# Patient Record
Sex: Female | Born: 1980 | Race: Black or African American | Hispanic: No | Marital: Single | Smoking: Never smoker
Health system: Southern US, Community
[De-identification: ages and names within clinical notes are randomized; demographics above are authoritative.]

## PROBLEM LIST (undated history)

## (undated) DIAGNOSIS — Z9109 Other allergy status, other than to drugs and biological substances: Secondary | ICD-10-CM

## (undated) DIAGNOSIS — J329 Chronic sinusitis, unspecified: Secondary | ICD-10-CM

## (undated) DIAGNOSIS — J45909 Unspecified asthma, uncomplicated: Secondary | ICD-10-CM

## (undated) DIAGNOSIS — M419 Scoliosis, unspecified: Secondary | ICD-10-CM

## (undated) DIAGNOSIS — T7840XA Allergy, unspecified, initial encounter: Secondary | ICD-10-CM

## (undated) DIAGNOSIS — B019 Varicella without complication: Secondary | ICD-10-CM

## (undated) DIAGNOSIS — R569 Unspecified convulsions: Secondary | ICD-10-CM

## (undated) HISTORY — DX: Unspecified asthma, uncomplicated: J45.909

## (undated) HISTORY — DX: Unspecified convulsions: R56.9

## (undated) HISTORY — DX: Allergy, unspecified, initial encounter: T78.40XA

## (undated) HISTORY — DX: Scoliosis, unspecified: M41.9

## (undated) HISTORY — DX: Varicella without complication: B01.9

---

## 2004-09-17 ENCOUNTER — Emergency Department (HOSPITAL_COMMUNITY): Admission: EM | Admit: 2004-09-17 | Discharge: 2004-09-17 | Payer: Self-pay | Admitting: Emergency Medicine

## 2005-07-01 ENCOUNTER — Emergency Department (HOSPITAL_COMMUNITY): Admission: EM | Admit: 2005-07-01 | Discharge: 2005-07-01 | Payer: Self-pay | Admitting: Family Medicine

## 2006-02-03 ENCOUNTER — Emergency Department (HOSPITAL_COMMUNITY): Admission: EM | Admit: 2006-02-03 | Discharge: 2006-02-03 | Payer: Self-pay | Admitting: Family Medicine

## 2011-11-05 ENCOUNTER — Ambulatory Visit (INDEPENDENT_AMBULATORY_CARE_PROVIDER_SITE_OTHER): Payer: BC Managed Care – PPO

## 2011-11-05 DIAGNOSIS — B354 Tinea corporis: Secondary | ICD-10-CM

## 2012-05-13 ENCOUNTER — Ambulatory Visit: Payer: BC Managed Care – PPO

## 2012-05-13 ENCOUNTER — Ambulatory Visit (INDEPENDENT_AMBULATORY_CARE_PROVIDER_SITE_OTHER): Payer: BC Managed Care – PPO | Admitting: Internal Medicine

## 2012-05-13 VITALS — BP 117/77 | HR 84 | Temp 97.9°F | Resp 16 | Ht 65.75 in | Wt 168.8 lb

## 2012-05-13 DIAGNOSIS — M25519 Pain in unspecified shoulder: Secondary | ICD-10-CM

## 2012-05-13 MED ORDER — IBUPROFEN 600 MG PO TABS: 600.0000 mg | ORAL_TABLET | Freq: Three times a day (TID) | ORAL | Status: AC | PRN

## 2012-05-13 NOTE — Patient Instructions (Addendum)
Impingement Syndrome, Rotator Cuff, Bursitis with Rehab Impingement syndrome is a condition that involves inflammation of the tendons of the rotator cuff and the subacromial bursa, that causes pain in the shoulder. The rotator cuff consists of four tendons and muscles that control much of the shoulder and upper arm function. The subacromial bursa is a fluid filled sac that helps reduce friction between the rotator cuff and one of the bones of the shoulder (acromion). Impingement syndrome is usually an overuse injury that causes swelling of the bursa (bursitis), swelling of the tendon (tendonitis), and/or a tear of the tendon (strain). Strains are classified into three categories. Grade 1 strains cause pain, but the tendon is not lengthened. Grade 2 strains include a lengthened ligament, due to the ligament being stretched or partially ruptured. With grade 2 strains there is still function, although the function may be decreased. Grade 3 strains include a complete tear of the tendon or muscle, and function is usually impaired. SYMPTOMS   Pain around the shoulder, often at the outer portion of the upper arm.   Pain that gets worse with shoulder function, especially when reaching overhead or lifting.   Sometimes, aching when not using the arm.   Pain that wakes you up at night.   Sometimes, tenderness, swelling, warmth, or redness over the affected area.   Loss of strength.   Limited motion of the shoulder, especially reaching behind the back (to the back pocket or to unhook bra) or across your body.   Crackling sound (crepitation) when moving the arm.   Biceps tendon pain and inflammation (in the front of the shoulder). Worse when bending the elbow or lifting.  CAUSES  Impingement syndrome is often an overuse injury, in which chronic (repetitive) motions cause the tendons or bursa to become inflamed. A strain occurs when a force is paced on the tendon or muscle that is greater than it can  withstand. Common mechanisms of injury include: Stress from sudden increase in duration, frequency, or intensity of training.  Direct hit (trauma) to the shoulder.   Aging, erosion of the tendon with normal use.   Bony bump on shoulder (acromial spur).  RISK INCREASES WITH:  Contact sports (football, wrestling, boxing).   Throwing sports (baseball, tennis, volleyball).   Weightlifting and bodybuilding.   Heavy labor.   Previous injury to the rotator cuff, including impingement.   Poor shoulder strength and flexibility.   Failure to warm up properly before activity.   Inadequate protective equipment.   Old age.   Bony bump on shoulder (acromial spur).  PREVENTION   Warm up and stretch properly before activity.   Allow for adequate recovery between workouts.   Maintain physical fitness:   Strength, flexibility, and endurance.   Cardiovascular fitness.   Learn and use proper exercise technique.  PROGNOSIS  If treated properly, impingement syndrome usually goes away within 6 weeks. Sometimes surgery is required.  RELATED COMPLICATIONS   Longer healing time if not properly treated, or if not given enough time to heal.   Recurring symptoms, that result in a chronic condition.   Shoulder stiffness, frozen shoulder, or loss of motion.   Rotator cuff tendon tear.   Recurring symptoms, especially if activity is resumed too soon, with overuse, with a direct blow, or when using poor technique.  TREATMENT  Treatment first involves the use of ice and medicine, to reduce pain and inflammation. The use of strengthening and stretching exercises may help reduce pain with activity. These exercises may   be performed at home or with a therapist. If non-surgical treatment is unsuccessful after more than 6 months, surgery may be advised. After surgery and rehabilitation, activity is usually possible in 3 months.  MEDICATION  If pain medicine is needed, nonsteroidal  anti-inflammatory medicines (aspirin and ibuprofen), or other minor pain relievers (acetaminophen), are often advised.   Do not take pain medicine for 7 days before surgery.   Prescription pain relievers may be given, if your caregiver thinks they are needed. Use only as directed and only as much as you need.   Corticosteroid injections may be given by your caregiver. These injections should be reserved for the most serious cases, because they may only be given a certain number of times.  HEAT AND COLD  Cold treatment (icing) should be applied for 10 to 15 minutes every 2 to 3 hours for inflammation and pain, and immediately after activity that aggravates your symptoms. Use ice packs or an ice massage.   Heat treatment may be used before performing stretching and strengthening activities prescribed by your caregiver, physical therapist, or athletic trainer. Use a heat pack or a warm water soak.  SEEK MEDICAL CARE IF:   Symptoms get worse or do not improve in 4 to 6 weeks, despite treatment.   New, unexplained symptoms develop. (Drugs used in treatment may produce side effects.)  EXERCISES  RANGE OF MOTION (ROM) AND STRETCHING EXERCISES - Impingement Syndrome (Rotator Cuff  Tendinitis, Bursitis) These exercises may help you when beginning to rehabilitate your injury. Your symptoms may go away with or without further involvement from your physician, physical therapist or athletic trainer. While completing these exercises, remember:   Restoring tissue flexibility helps normal motion to return to the joints. This allows healthier, less painful movement and activity.   An effective stretch should be held for at least 30 seconds.   A stretch should never be painful. You should only feel a gentle lengthening or release in the stretched tissue.  STRETCH - Flexion, Standing  Stand with good posture. With an underhand grip on your right / left hand, and an overhand grip on the opposite hand, grasp  a broomstick or cane so that your hands are a little more than shoulder width apart.   Keeping your right / left elbow straight and shoulder muscles relaxed, push the stick with your opposite hand, to raise your right / left arm in front of your body and then overhead. Raise your arm until you feel a stretch in your right / left shoulder, but before you have increased shoulder pain.   Try to avoid shrugging your right / left shoulder as your arm rises, by keeping your shoulder blade tucked down and toward your mid-back spine. Hold for __________ seconds.   Slowly return to the starting position.  Repeat __________ times. Complete this exercise __________ times per day. STRETCH - Abduction, Supine  Lie on your back. With an underhand grip on your right / left hand and an overhand grip on the opposite hand, grasp a broomstick or cane so that your hands are a little more than shoulder width apart.   Keeping your right / left elbow straight and your shoulder muscles relaxed, push the stick with your opposite hand, to raise your right / left arm out to the side of your body and then overhead. Raise your arm until you feel a stretch in your right / left shoulder, but before you have increased shoulder pain.   Try to avoid shrugging   your right / left shoulder as your arm rises, by keeping your shoulder blade tucked down and toward your mid-back spine. Hold for __________ seconds.   Slowly return to the starting position.  Repeat __________ times. Complete this exercise __________ times per day. ROM - Flexion, Active-Assisted  Lie on your back. You may bend your knees for comfort.   Grasp a broomstick or cane so your hands are about shoulder width apart. Your right / left hand should grip the end of the stick, so that your hand is positioned "thumbs-up," as if you were about to shake hands.   Using your healthy arm to lead, raise your right / left arm overhead, until you feel a gentle stretch in your  shoulder. Hold for __________ seconds.   Use the stick to assist in returning your right / left arm to its starting position.  Repeat __________ times. Complete this exercise __________ times per day.  ROM - Internal Rotation, Supine   Lie on your back on a firm surface. Place your right / left elbow about 60 degrees away from your side. Elevate your elbow with a folded towel, so that the elbow and shoulder are the same height.   Using a broomstick or cane and your strong arm, pull your right / left hand toward your body until you feel a gentle stretch, but no increase in your shoulder pain. Keep your shoulder and elbow in place throughout the exercise.   Hold for __________ seconds. Slowly return to the starting position.  Repeat __________ times. Complete this exercise __________ times per day. STRETCH - Internal Rotation  Place your right / left hand behind your back, palm up.   Throw a towel or belt over your opposite shoulder. Grasp the towel with your right / left hand.   While keeping an upright posture, gently pull up on the towel, until you feel a stretch in the front of your right / left shoulder.   Avoid shrugging your right / left shoulder as your arm rises, by keeping your shoulder blade tucked down and toward your mid-back spine.   Hold for __________ seconds. Release the stretch, by lowering your healthy hand.  Repeat __________ times. Complete this exercise __________ times per day. ROM - Internal Rotation   Using an underhand grip, grasp a stick behind your back with both hands.   While standing upright with good posture, slide the stick up your back until you feel a mild stretch in the front of your shoulder.   Hold for __________ seconds. Slowly return to your starting position.  Repeat __________ times. Complete this exercise __________ times per day.  STRETCH - Posterior Shoulder Capsule   Stand or sit with good posture. Grasp your right / left elbow and draw it  across your chest, keeping it at the same height as your shoulder.   Pull your elbow, so your upper arm comes in closer to your chest. Pull until you feel a gentle stretch in the back of your shoulder.   Hold for __________ seconds.  Repeat __________ times. Complete this exercise __________ times per day. STRENGTHENING EXERCISES - Impingement Syndrome (Rotator Cuff Tendinitis, Bursitis) These exercises may help you when beginning to rehabilitate your injury. They may resolve your symptoms with or without further involvement from your physician, physical therapist or athletic trainer. While completing these exercises, remember:  Muscles can gain both the endurance and the strength needed for everyday activities through controlled exercises.   Complete these exercises as   instructed by your physician, physical therapist or athletic trainer. Increase the resistance and repetitions only as guided.   You may experience muscle soreness or fatigue, but the pain or discomfort you are trying to eliminate should never worsen during these exercises. If this pain does get worse, stop and make sure you are following the directions exactly. If the pain is still present after adjustments, discontinue the exercise until you can discuss the trouble with your clinician.   During your recovery, avoid activity or exercises which involve actions that place your injured hand or elbow above your head or behind your back or head. These positions stress the tissues which you are trying to heal.  STRENGTH - Scapular Depression and Adduction   With good posture, sit on a firm chair. Support your arms in front of you, with pillows, arm rests, or on a table top. Have your elbows in line with the sides of your body.   Gently draw your shoulder blades down and toward your mid-back spine. Gradually increase the tension, without tensing the muscles along the top of your shoulders and the back of your neck.   Hold for  __________ seconds. Slowly release the tension and relax your muscles completely before starting the next repetition.   After you have practiced this exercise, remove the arm support and complete the exercise in standing as well as sitting position.  Repeat __________ times. Complete this exercise __________ times per day.  STRENGTH - Shoulder Abductors, Isometric  With good posture, stand or sit about 4-6 inches from a wall, with your right / left side facing the wall.   Bend your right / left elbow. Gently press your right / left elbow into the wall. Increase the pressure gradually, until you are pressing as hard as you can, without shrugging your shoulder or increasing any shoulder discomfort.   Hold for __________ seconds.   Release the tension slowly. Relax your shoulder muscles completely before you begin the next repetition.  Repeat __________ times. Complete this exercise __________ times per day.  STRENGTH - External Rotators, Isometric  Keep your right / left elbow at your side and bend it 90 degrees.   Step into a door frame so that the outside of your right / left wrist can press against the door frame without your upper arm leaving your side.   Gently press your right / left wrist into the door frame, as if you were trying to swing the back of your hand away from your stomach. Gradually increase the tension, until you are pressing as hard as you can, without shrugging your shoulder or increasing any shoulder discomfort.   Hold for __________ seconds.   Release the tension slowly. Relax your shoulder muscles completely before you begin the next repetition.  Repeat __________ times. Complete this exercise __________ times per day.  STRENGTH - Supraspinatus   Stand or sit with good posture. Grasp a __________ weight, or an exercise band or tubing, so that your hand is "thumbs-up," like you are shaking hands.   Slowly lift your right / left arm in a "V" away from your thigh,  diagonally into the space between your side and straight ahead. Lift your hand to shoulder height or as far as you can, without increasing any shoulder pain. At first, many people do not lift their hands above shoulder height.   Avoid shrugging your right / left shoulder as your arm rises, by keeping your shoulder blade tucked down and toward your mid-back   spine.   Hold for __________ seconds. Control the descent of your hand, as you slowly return to your starting position.  Repeat __________ times. Complete this exercise __________ times per day.  STRENGTH - External Rotators  Secure a rubber exercise band or tubing to a fixed object (table, pole) so that it is at the same height as your right / left elbow when you are standing or sitting on a firm surface.   Stand or sit so that the secured exercise band is at your uninjured side.   Bend your right / left elbow 90 degrees. Place a folded towel or small pillow under your right / left arm, so that your elbow is a few inches away from your side.   Keeping the tension on the exercise band, pull it away from your body, as if pivoting on your elbow. Be sure to keep your body steady, so that the movement is coming only from your rotating shoulder.   Hold for __________ seconds. Release the tension in a controlled manner, as you return to the starting position.  Repeat __________ times. Complete this exercise __________ times per day.  STRENGTH - Internal Rotators   Secure a rubber exercise band or tubing to a fixed object (table, pole) so that it is at the same height as your right / left elbow when you are standing or sitting on a firm surface.   Stand or sit so that the secured exercise band is at your right / left side.   Bend your elbow 90 degrees. Place a folded towel or small pillow under your right / left arm so that your elbow is a few inches away from your side.   Keeping the tension on the exercise band, pull it across your body,  toward your stomach. Be sure to keep your body steady, so that the movement is coming only from your rotating shoulder.   Hold for __________ seconds. Release the tension in a controlled manner, as you return to the starting position.  Repeat __________ times. Complete this exercise __________ times per day.  STRENGTH - Scapular Protractors, Standing   Stand arms length away from a wall. Place your hands on the wall, keeping your elbows straight.   Begin by dropping your shoulder blades down and toward your mid-back spine.   To strengthen your protractors, keep your shoulder blades down, but slide them forward on your rib cage. It will feel as if you are lifting the back of your rib cage away from the wall. This is a subtle motion and can be challenging to complete. Ask your caregiver for further instruction, if you are not sure you are doing the exercise correctly.   Hold for __________ seconds. Slowly return to the starting position, resting the muscles completely before starting the next repetition.  Repeat __________ times. Complete this exercise __________ times per day. STRENGTH - Scapular Protractors, Supine  Lie on your back on a firm surface. Extend your right / left arm straight into the air while holding a __________ weight in your hand.   Keeping your head and back in place, lift your shoulder off the floor.   Hold for __________ seconds. Slowly return to the starting position, and allow your muscles to relax completely before starting the next repetition.  Repeat __________ times. Complete this exercise __________ times per day. STRENGTH - Scapular Protractors, Quadruped  Get onto your hands and knees, with your shoulders directly over your hands (or as close as you can   be, comfortably).   Keeping your elbows locked, lift the back of your rib cage up into your shoulder blades, so your mid-back rounds out. Keep your neck muscles relaxed.   Hold this position for __________  seconds. Slowly return to the starting position and allow your muscles to relax completely before starting the next repetition.  Repeat __________ times. Complete this exercise __________ times per day.  STRENGTH - Scapular Retractors  Secure a rubber exercise band or tubing to a fixed object (table, pole), so that it is at the height of your shoulders when you are either standing, or sitting on a firm armless chair.   With a palm down grip, grasp an end of the band in each hand. Straighten your elbows and lift your hands straight in front of you, at shoulder height. Step back, away from the secured end of the band, until it becomes tense.   Squeezing your shoulder blades together, draw your elbows back toward your sides, as you bend them. Keep your upper arms lifted away from your body throughout the exercise.   Hold for __________ seconds. Slowly ease the tension on the band, as you reverse the directions and return to the starting position.  Repeat __________ times. Complete this exercise __________ times per day. STRENGTH - Shoulder Extensors   Secure a rubber exercise band or tubing to a fixed object (table, pole) so that it is at the height of your shoulders when you are either standing, or sitting on a firm armless chair.   With a thumbs-up grip, grasp an end of the band in each hand. Straighten your elbows and lift your hands straight in front of you, at shoulder height. Step back, away from the secured end of the band, until it becomes tense.   Squeezing your shoulder blades together, pull your hands down to the sides of your thighs. Do not allow your hands to go behind you.   Hold for __________ seconds. Slowly ease the tension on the band, as you reverse the directions and return to the starting position.  Repeat __________ times. Complete this exercise __________ times per day.  STRENGTH - Scapular Retractors and External Rotators   Secure a rubber exercise band or tubing to a  fixed object (table, pole) so that it is at the height as your shoulders, when you are either standing, or sitting on a firm armless chair.   With a palm down grip, grasp an end of the band in each hand. Bend your elbows 90 degrees and lift your elbows to shoulder height, at your sides. Step back, away from the secured end of the band, until it becomes tense.   Squeezing your shoulder blades together, rotate your shoulders so that your upper arms and elbows remain stationary, but your fists travel upward to head height.   Hold for __________ seconds. Slowly ease the tension on the band, as you reverse the directions and return to the starting position.  Repeat __________ times. Complete this exercise __________ times per day.  STRENGTH - Scapular Retractors and External Rotators, Rowing   Secure a rubber exercise band or tubing to a fixed object (table, pole) so that it is at the height of your shoulders, when you are either standing, or sitting on a firm armless chair.   With a palm down grip, grasp an end of the band in each hand. Straighten your elbows and lift your hands straight in front of you, at shoulder height. Step back, away from the   secured end of the band, until it becomes tense.   Step 1: Squeeze your shoulder blades together. Bending your elbows, draw your hands to your chest, as if you are rowing a boat. At the end of this motion, your hands and elbow should be at shoulder height and your elbows should be out to your sides.   Step 2: Rotate your shoulders, to raise your hands above your head. Your forearms should be vertical and your upper arms should be horizontal.   Hold for __________ seconds. Slowly ease the tension on the band, as you reverse the directions and return to the starting position.  Repeat __________ times. Complete this exercise __________ times per day.  STRENGTH - Scapular Depressors  Find a sturdy chair without wheels, such as a dining room chair.    Keeping your feet on the floor, and your hands on the chair arms, lift your bottom up from the seat, and lock your elbows.   Keeping your elbows straight, allow gravity to pull your body weight down. Your shoulders will rise toward your ears.   Raise your body against gravity by drawing your shoulder blades down your back, shortening the distance between your shoulders and ears. Although your feet should always maintain contact with the floor, your feet should progressively support less body weight, as you get stronger.   Hold for __________ seconds. In a controlled and slow manner, lower your body weight to begin the next repetition.  Repeat __________ times. Complete this exercise __________ times per day.  Document Released: 11/17/2005 Document Revised: 11/06/2011 Document Reviewed: 03/01/2009 [employer] Patient Information 2012 [employer] 

## 2012-05-13 NOTE — Progress Notes (Signed)
  Subjective:    Patient ID: Crystal Sims, female    DOB: 07/16/1981, 31 y.o.   MRN: [id number]  HPI Was doing a lot of lifting, later felt an ache in her L shoulder. Has improved a lot.    Review of Systems    neg Objective:   Physical Exam Left shoulder full rom, NMS fully intact Neg apprehension,neg empty can Minor pain internal rotation and extention  [facility] reading (PRIMARY) by  Dr.Sury Wentworth nad.         Assessment & Plan:  Stretches/exercises Motrin 600mg

## 2012-07-13 ENCOUNTER — Ambulatory Visit: Payer: Self-pay | Admitting: Family Medicine

## 2012-09-02 ENCOUNTER — Encounter: Payer: Self-pay | Admitting: Family Medicine

## 2012-09-02 ENCOUNTER — Ambulatory Visit (INDEPENDENT_AMBULATORY_CARE_PROVIDER_SITE_OTHER): Payer: Self-pay | Admitting: Family Medicine

## 2012-09-02 VITALS — BP 124/70 | HR 86 | Temp 97.8°F | Ht 65.0 in | Wt 173.0 lb

## 2012-09-02 DIAGNOSIS — Z0000 Encounter for general adult medical examination without abnormal findings: Secondary | ICD-10-CM

## 2012-09-02 DIAGNOSIS — Z23 Encounter for immunization: Secondary | ICD-10-CM

## 2012-09-02 NOTE — Assessment & Plan Note (Signed)
New.  Pt's PE WNL.  Check labs.  Anticipatory guidance provided.  

## 2012-09-02 NOTE — Patient Instructions (Addendum)
Follow up in 1 year or as needed We'll notify you of your lab results Keep up the good work!  You look great! Call with any questions or concerns Think of us as your home base Welcome!  We're glad to have you! 

## 2012-09-02 NOTE — Progress Notes (Signed)
  Subjective:    Patient ID: Crystal Sims, female    DOB: 1981-04-30, 31 y.o.   MRN: [id number]  HPI New to establish.  Previous MD- [facility]  GYN- Cousins, UTD  No concerns today   Review of Systems Patient reports no vision/ hearing changes, adenopathy,fever, weight change,  persistant/recurrent hoarseness , swallowing issues, chest pain, palpitations, edema, persistant/recurrent cough, hemoptysis, dyspnea (rest/exertional/paroxysmal nocturnal), gastrointestinal bleeding (melena, rectal bleeding), abdominal pain, significant heartburn, bowel changes, GU symptoms (dysuria, hematuria, incontinence), Gyn symptoms (abnormal  bleeding, pain),  syncope, focal weakness, memory loss, numbness & tingling, skin/hair/nail changes, abnormal bruising or bleeding, anxiety, or depression.     Objective:   Physical Exam General Appearance:    Alert, cooperative, no distress, appears stated age  Head:    Normocephalic, without obvious abnormality, atraumatic  Eyes:    PERRL, conjunctiva/corneas clear, EOM's intact, fundi    benign, both eyes  Ears:    Normal TM's and external ear canals, both ears  Nose:   Nares normal, septum midline, mucosa normal, no drainage    or sinus tenderness  Throat:   Lips, mucosa, and tongue normal; teeth and gums normal  Neck:   Supple, symmetrical, trachea midline, no adenopathy;    Thyroid: no enlargement/tenderness/nodules  Back:     Symmetric, no curvature, ROM normal, no CVA tenderness  Lungs:     Clear to auscultation bilaterally, respirations unlabored  Chest Wall:    No tenderness or deformity   Heart:    Regular rate and rhythm, S1 and S2 normal, no murmur, rub   or gallop  Breast Exam:    Deferred to GYN  Abdomen:     Soft, non-tender, bowel sounds active all four quadrants,    no masses, no organomegaly  Genitalia:    Deferred to GYN  Rectal:    Extremities:   Extremities normal, atraumatic, no cyanosis or edema  Pulses:   2+ and symmetric all extremities   Skin:   Skin color, texture, turgor normal, no rashes or lesions  Lymph nodes:   Cervical, supraclavicular, and axillary nodes normal  Neurologic:   CNII-XII intact, normal strength, sensation and reflexes    throughout          Assessment & Plan:

## 2012-09-03 LAB — CBC WITH DIFFERENTIAL/PLATELET
Basophils Relative: 0.3 % (ref 0.0–3.0)
Eosinophils Absolute: 0 10*3/uL (ref 0.0–0.7)
Eosinophils Relative: 0.7 % (ref 0.0–5.0)
HCT: 35.9 % — ABNORMAL LOW (ref 36.0–46.0)
Lymphs Abs: 1.9 10*3/uL (ref 0.7–4.0)
MCHC: 32.4 g/dL (ref 30.0–36.0)
MCV: 91.1 fl (ref 78.0–100.0)
Monocytes Absolute: 0.3 10*3/uL (ref 0.1–1.0)
Neutrophils Relative %: 49.7 % (ref 43.0–77.0)
Platelets: 291 10*3/uL (ref 150.0–400.0)
WBC: 4.4 10*3/uL — ABNORMAL LOW (ref 4.5–10.5)

## 2012-09-03 LAB — BASIC METABOLIC PANEL
BUN: 11 mg/dL (ref 6–23)
Calcium: 9.2 mg/dL (ref 8.4–10.5)
GFR: 149.22 mL/min (ref >60.00)
Glucose, Bld: 82 mg/dL (ref 70–99)
Sodium: 138 mEq/L (ref 135–145)

## 2012-09-03 LAB — LIPID PANEL
HDL: 49.6 mg/dL (ref >39.00)
Triglycerides: 67 mg/dL (ref 0.0–149.0)
VLDL: 13.4 mg/dL (ref 0.0–40.0)

## 2012-09-03 LAB — TSH: TSH: 0.3 u[IU]/mL — ABNORMAL LOW (ref 0.35–5.50)

## 2012-09-03 LAB — HEPATIC FUNCTION PANEL
Albumin: 4 g/dL (ref 3.5–5.2)
Total Bilirubin: 0.5 mg/dL (ref 0.3–1.2)

## 2012-09-06 ENCOUNTER — Encounter: Payer: Self-pay | Admitting: *Deleted

## 2012-09-06 LAB — VITAMIN D 1,25 DIHYDROXY: Vitamin D2 1, 25 (OH)2: <8 pg/mL

## 2012-09-08 ENCOUNTER — Telehealth: Payer: Self-pay

## 2012-09-08 NOTE — Telephone Encounter (Signed)
Called left pt message to call in and schedule labs t3/t4.       MW

## 2012-09-09 ENCOUNTER — Other Ambulatory Visit: Payer: BC Managed Care – PPO

## 2012-09-16 ENCOUNTER — Other Ambulatory Visit (INDEPENDENT_AMBULATORY_CARE_PROVIDER_SITE_OTHER): Payer: BC Managed Care – PPO

## 2012-09-16 DIAGNOSIS — R946 Abnormal results of thyroid function studies: Secondary | ICD-10-CM

## 2012-09-16 DIAGNOSIS — R7989 Other specified abnormal findings of blood chemistry: Secondary | ICD-10-CM

## 2012-09-17 ENCOUNTER — Encounter: Payer: Self-pay | Admitting: *Deleted

## 2012-09-17 LAB — T3, FREE: T3, Free: 3.1 pg/mL (ref 2.3–4.2)

## 2012-09-23 ENCOUNTER — Telehealth: Payer: Self-pay

## 2012-09-23 NOTE — Telephone Encounter (Signed)
Pt called left message on triage line stating needing clarity of lab results, states T3 T4 normal  TSH abnormal and need clarity if something is wrong. Called pt back and left message to call back for clarity.     MW

## 2012-09-23 NOTE — Telephone Encounter (Signed)
Spoke to pt and clarified lab results and pt concerns of T3T4 normal and TSH abnormal. Pt stated understanding and stated when she has time she will call in and schedule 6 month lab appt.     MW

## 2012-10-04 ENCOUNTER — Telehealth: Payer: Self-pay

## 2012-10-04 NOTE — Telephone Encounter (Signed)
Pt called and left a message on triage line stating wanted test results and is trying to sign up for health insurance and need some questions answered. Returned pt call, Called pt work number so I called and left message on pt mobile.    MW

## 2012-10-05 NOTE — Telephone Encounter (Signed)
Pt had questions concerning BP at last OV which was 124/70. Pt wanted to make sure it was normal. I advised pt it was normal until its over 140/90. Pt stated understanding.    MW

## 2012-12-14 ENCOUNTER — Ambulatory Visit (INDEPENDENT_AMBULATORY_CARE_PROVIDER_SITE_OTHER): Payer: BC Managed Care – PPO | Admitting: Physician Assistant

## 2012-12-14 VITALS — BP 101/70 | HR 77 | Temp 97.7°F | Resp 16 | Ht 65.0 in | Wt 178.0 lb

## 2012-12-14 DIAGNOSIS — J329 Chronic sinusitis, unspecified: Secondary | ICD-10-CM

## 2012-12-14 DIAGNOSIS — R0982 Postnasal drip: Secondary | ICD-10-CM

## 2012-12-14 DIAGNOSIS — J069 Acute upper respiratory infection, unspecified: Secondary | ICD-10-CM

## 2012-12-14 MED ORDER — BENZONATATE 100 MG PO CAPS: 100.0000 mg | ORAL_CAPSULE | Freq: Three times a day (TID) | ORAL | Status: DC | PRN

## 2012-12-14 MED ORDER — IPRATROPIUM BROMIDE 0.03 % NA SOLN: 2.0000 | Freq: Two times a day (BID) | NASAL | Status: DC

## 2012-12-14 MED ORDER — PSEUDOEPHEDRINE HCL 60 MG PO TABS: 60.0000 mg | ORAL_TABLET | Freq: Four times a day (QID) | ORAL | Status: DC | PRN

## 2012-12-14 MED ORDER — DM-GUAIFENESIN ER 30-600 MG PO TB12: 1.0000 | ORAL_TABLET | Freq: Two times a day (BID) | ORAL | Status: DC

## 2012-12-14 NOTE — Patient Instructions (Addendum)
Continue using Allegra daily.   I have sent several medicines to the pharmacy to help with symptoms: (1) pseudoephedrine 60mg  - take 1 tab every 6 hours as needed for nasal congestion/post-nasal drainage (take for a max of 3 days) (2) Atrovent nasal spray - twice daily for relief of nasal congestion/post-nasal draingage (3) Mucinex DM - 1 tab twice daily for to help with cough and to help thin mucus (4) Tessalon - three times a day as needed for cough  Plenty of fluids (water is best!) and rest!  Rest your voice when possible.  Warm liquids like tea with lemon and honey can help.  Using a humidifier or vaporizer can also help.  Please let us know if you are worsening or not improving.   Upper Respiratory Infection, Adult An upper respiratory infection (URI) is also sometimes known as the common cold. The upper respiratory tract includes the nose, sinuses, throat, trachea, and bronchi. Bronchi are the airways leading to the lungs. Most people improve within 1 week, but symptoms can last up to 2 weeks. A residual cough may last even longer.  CAUSES Many different viruses can infect the tissues lining the upper respiratory tract. The tissues become irritated and inflamed and often become very moist. Mucus production is also common. A cold is contagious. You can easily spread the virus to others by oral contact. This includes kissing, sharing a glass, coughing, or sneezing. Touching your mouth or nose and then touching a surface, which is then touched by another person, can also spread the virus. SYMPTOMS  Symptoms typically develop 1 to 3 days after you come in contact with a cold virus. Symptoms vary from person to person. They may include:  Runny nose.  Sneezing.  Nasal congestion.  Sinus irritation.  Sore throat.  Loss of voice (laryngitis).  Cough.  Fatigue.  Muscle aches.  Loss of appetite.  Headache.  Low-grade fever. DIAGNOSIS  You might diagnose your own cold based on  familiar symptoms, since most people get a cold 2 to 3 times a year. Your caregiver can confirm this based on your exam. Most importantly, your caregiver can check that your symptoms are not due to another disease such as strep throat, sinusitis, pneumonia, asthma, or epiglottitis. Blood tests, throat tests, and X-rays are not necessary to diagnose a common cold, but they may sometimes be helpful in excluding other more serious diseases. Your caregiver will decide if any further tests are required. RISKS AND COMPLICATIONS  You may be at risk for a more severe case of the common cold if you smoke cigarettes, have chronic heart disease (such as heart failure) or lung disease (such as asthma), or if you have a weakened immune system. The very young and very old are also at risk for more serious infections. Bacterial sinusitis, middle ear infections, and bacterial pneumonia can complicate the common cold. The common cold can worsen asthma and chronic obstructive pulmonary disease (COPD). Sometimes, these complications can require emergency medical care and may be life-threatening. PREVENTION  The best way to protect against getting a cold is to practice good hygiene. Avoid oral or hand contact with people with cold symptoms. Wash your hands often if contact occurs. There is no clear evidence that vitamin C, vitamin E, echinacea, or exercise reduces the chance of developing a cold. However, it is always recommended to get plenty of rest and practice good nutrition. TREATMENT  Treatment is directed at relieving symptoms. There is no cure. Antibiotics are not effective,  because the infection is caused by a virus, not by bacteria. Treatment may include:  Increased fluid intake. Sports drinks offer valuable electrolytes, sugars, and fluids.  Breathing heated mist or steam (vaporizer or shower).  Eating chicken soup or other clear broths, and maintaining good nutrition.  Getting plenty of rest.  Using gargles  or lozenges for comfort.  Controlling fevers with ibuprofen or acetaminophen as directed by your caregiver.  Increasing usage of your inhaler if you have asthma. Zinc gel and zinc lozenges, taken in the first 24 hours of the common cold, can shorten the duration and lessen the severity of symptoms. Pain medicines may help with fever, muscle aches, and throat pain. A variety of non-prescription medicines are available to treat congestion and runny nose. Your caregiver can make recommendations and may suggest nasal or lung inhalers for other symptoms.  HOME CARE INSTRUCTIONS   Only take over-the-counter or prescription medicines for pain, discomfort, or fever as directed by your caregiver.  Use a warm mist humidifier or inhale steam from a shower to increase air moisture. This may keep secretions moist and make it easier to breathe.  Drink enough water and fluids to keep your urine clear or pale yellow.  Rest as needed.  Return to work when your temperature has returned to normal or as your caregiver advises. You may need to stay home longer to avoid infecting others. You can also use a face mask and careful hand washing to prevent spread of the virus. SEEK MEDICAL CARE IF:   After the first few days, you feel you are getting worse rather than better.  You need your caregiver's advice about medicines to control symptoms.  You develop chills, worsening shortness of breath, or brown or red sputum. These may be signs of pneumonia.  You develop yellow or brown nasal discharge or pain in the face, especially when you bend forward. These may be signs of sinusitis.  You develop a fever, swollen neck glands, pain with swallowing, or white areas in the back of your throat. These may be signs of strep throat. SEEK IMMEDIATE MEDICAL CARE IF:   You have a fever.  You develop severe or persistent headache, ear pain, sinus pain, or chest pain.  You develop wheezing, a prolonged cough, cough up  blood, or have a change in your usual mucus (if you have chronic lung disease).  You develop sore muscles or a stiff neck. Document Released: 05/13/2001 Document Revised: 02/09/2012 Document Reviewed: 03/21/2011 [facility] Patient Information 2013 [place], [place].

## 2012-12-14 NOTE — Progress Notes (Signed)
Subjective:    Patient ID: Crystal Sims, female    DOB: 11-14-1981, 32 y.o.   MRN: [id number]  HPI   Ms. Favor is a 32 yr old female with URI symptoms.  States it began last week with "sinus drainage", continues to have lots of post-nasal drainage.  Drainage is worse at night.  States she is coughing up mucus, but then states it is a "dry cough".  Has had some sore throat with the cough.  Has begun to lose her voice.  Denies sinus pressure or nasal drainage.  No fever or chills.  No GI symptoms.  No body aches.    Takes Allegra daily for allergies.  Also gets an allergy shot every week.  Has been using OTC cold prep which has helped somewhat with cough.     Review of Systems  Constitutional: Negative for fever and chills.  HENT: Positive for congestion, sore throat and postnasal drip. Negative for ear pain and sinus pressure.   Respiratory: Positive for cough. Negative for shortness of breath and wheezing.   Cardiovascular: Negative.   Gastrointestinal: Negative.   Musculoskeletal: Negative.   Skin: Negative.   Neurological: Negative.        Objective:   Physical Exam  Vitals reviewed. Constitutional: She is oriented to person, place, and time. She appears well-developed and well-nourished. No distress.  HENT:  Head: Normocephalic and atraumatic.  Right Ear: Tympanic membrane and ear canal normal.  Left Ear: Tympanic membrane and ear canal normal.  Nose: Mucosal edema present. Right sinus exhibits no maxillary sinus tenderness and no frontal sinus tenderness. Left sinus exhibits no maxillary sinus tenderness and no frontal sinus tenderness.  Mouth/Throat: Uvula is midline and mucous membranes are normal. Posterior oropharyngeal erythema present. No oropharyngeal exudate or posterior oropharyngeal edema.       Turbinates pale  Neck: Neck supple.  Cardiovascular: Normal rate, regular rhythm, normal heart sounds and intact distal pulses.  Exam reveals no gallop and no friction rub.     No murmur heard. Pulmonary/Chest: Effort normal and breath sounds normal. She has no wheezes. She has no rales.  Abdominal: Soft. Bowel sounds are normal. There is no tenderness.  Lymphadenopathy:    She has no cervical adenopathy.  Neurological: She is alert and oriented to person, place, and time.  Skin: Skin is warm and dry.  Psychiatric: She has a normal mood and affect. Her behavior is normal.     Filed Vitals:   12/14/12 0951  BP: 101/70  Pulse: 77  Temp: 97.7 F (36.5 C)  Resp: 16        Assessment & Plan:   1. URI (upper respiratory infection)  ipratropium (ATROVENT) 0.03 % nasal spray, pseudoephedrine (SUDAFED) 60 MG tablet, dextromethorphan-guaiFENesin (MUCINEX DM) 30-600 MG per 12 hr tablet, benzonatate (TESSALON) 100 MG capsule  2. Post-nasal drainage  ipratropium (ATROVENT) 0.03 % nasal spray, pseudoephedrine (SUDAFED) 60 MG tablet    Ms. Crystal Sims is a 32 yr old female with URI symptoms.  Afebrile, lungs CTA.  Post-nasal drainage appears to be her most bothersome symptom.  Encouraged her to continue daily Allegra.  Will add Atrovent nasal spray and pseudoephedrine for increased relief of congestion and post-nasal drainage.  Mucinex-DM and Tessalon for cough.  Encouraged plenty of fluids and rest.  Voice rest when possible.  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  Will keep her out of work today and tomorrow to allow for adequate rest.  Note given.  Discussed RTC precautions.  Pt will let us know if worsening or not improving.

## 2012-12-17 ENCOUNTER — Ambulatory Visit: Payer: BC Managed Care – PPO

## 2012-12-17 ENCOUNTER — Ambulatory Visit (INDEPENDENT_AMBULATORY_CARE_PROVIDER_SITE_OTHER): Payer: BC Managed Care – PPO | Admitting: Family Medicine

## 2012-12-17 VITALS — BP 119/83 | HR 86 | Temp 98.1°F | Resp 16 | Ht 66.0 in | Wt 178.4 lb

## 2012-12-17 DIAGNOSIS — R5381 Other malaise: Secondary | ICD-10-CM

## 2012-12-17 DIAGNOSIS — J40 Bronchitis, not specified as acute or chronic: Secondary | ICD-10-CM

## 2012-12-17 DIAGNOSIS — R05 Cough: Secondary | ICD-10-CM

## 2012-12-17 DIAGNOSIS — J342 Deviated nasal septum: Secondary | ICD-10-CM

## 2012-12-17 DIAGNOSIS — R059 Cough, unspecified: Secondary | ICD-10-CM

## 2012-12-17 DIAGNOSIS — J329 Chronic sinusitis, unspecified: Secondary | ICD-10-CM

## 2012-12-17 MED ORDER — CEFDINIR 300 MG PO CAPS: 300.0000 mg | ORAL_CAPSULE | Freq: Two times a day (BID) | ORAL | Status: DC

## 2012-12-17 NOTE — Patient Instructions (Addendum)
Use the antibiotic as directed.  Let us know if you are not better in the next several days- Sooner if worse.

## 2012-12-17 NOTE — Progress Notes (Signed)
[facility] [address], [place] [address] [phone]  Date:  12/17/2012   Name:  Crystal Sims   DOB:  [birth year]   MRN:  [id number]  PCP:  Neena Rhymes, MD    Chief Complaint: URI and Shortness of Breath   History of Present Illness:  Crystal Sims is a 32 y.o. very pleasant female patient who presents with the following:  She was here 3 days ago with a URI- sinus drainage, dry cough.  We started tessalon, atrovent nasal and sudafed/ mucinex.   She is here today for a recheck- she does not feel much better as of yet.  She had a hard time sleeping last night, and then today she coughed for a long time.  She is draining from her sinuses and coughing up large amounts of mucus.   She has noted a subjective fever- yesterday she was sweating.   She notes mild body aches.  She has had chills.    This morning she had an episode of post- tussive emesis.  No diarrhea.   She has not used any fever reducers so far today.    Patient Active Problem List  Diagnosis  . Routine general medical examination at a health care facility    Past Medical History  Diagnosis Date  . Chicken pox   . Seizures infancy    Noted only occured once with a fever during infancy  . Scoliosis     No past surgical history on file.  History  Substance Use Topics  . Smoking status: Never Smoker   . Smokeless tobacco: Not on file  . Alcohol Use: No    Family History  Problem Relation Age of Onset  . Hypertension Mother   . Diabetes Mother   . Hypertension Father   . Kidney disease Father   . Diabetes Father   . Hypertension Maternal Grandmother   . Diabetes Maternal Grandmother   . Cancer Maternal Grandfather   . Stroke Maternal Grandfather   . Hypertension Maternal Grandfather   . Diabetes Maternal Grandfather   . Arthritis Paternal Grandmother   . Hypertension Paternal Grandmother   . Diabetes Paternal Grandmother   . Hypertension Paternal Grandfather   .  Diabetes Paternal Grandfather     No Known Allergies  Medication list has been reviewed and updated.  Current Outpatient Prescriptions on File Prior to Visit  Medication Sig Dispense Refill  . benzonatate (TESSALON) 100 MG capsule Take 1-2 capsules (100-200 mg total) by mouth 3 (three) times daily as needed for cough.  40 capsule  0  . dextromethorphan-guaiFENesin (MUCINEX DM) 30-600 MG per 12 hr tablet Take 1 tablet by mouth every 12 (twelve) hours.  30 tablet  0  . Fexofenadine HCl (ALLEGRA PO) Take 1 tablet by mouth daily.      Marland Kitchen ipratropium (ATROVENT) 0.03 % nasal spray Place 2 sprays into the nose 2 (two) times daily.  30 mL  1  . Multiple Vitamin (MULTIVITAMIN) tablet Take 1 tablet by mouth daily.      . pseudoephedrine (SUDAFED) 60 MG tablet Take 1 tablet (60 mg total) by mouth every 6 (six) hours as needed for congestion.  30 tablet  0    Review of Systems:  As per HPI- otherwise negative.   Physical Examination: Filed Vitals:   12/17/12 1156  BP: 119/83  Pulse: 86  Temp: 98.1 F (36.7 C)  Resp: 16   Filed Vitals:   12/17/12 1156  Height:  5\' 6"  (1.676 m)  Weight: 178 lb 6.4 oz (80.922 kg)   Body mass index is 28.79 kg/(m^2). Ideal Body Weight: Weight in (lb) to have BMI = 25: 154.6   GEN: WDWN, NAD, Non-toxic, A & O x 3 HEENT: Atraumatic, Normocephalic. Neck supple. No masses, No LAD. Bilateral TM wnl, oropharynx normal.  PEERL,EOMI.   Ears and Nose: No external deformity. CV: RRR, No M/G/R. No JVD. No thrill. No extra heart sounds. PULM: CTA B, no wheezes, crackles, rhonchi. No retractions. No resp. distress. No accessory muscle use. ABD: S, NT, ND, +BS. No rebound. No HSM. EXTR: No c/c/e NEURO Normal gait.  PSYCH: Normally interactive. Conversant. Not depressed or anxious appearing.  Calm demeanor.   [facility] reading (PRIMARY) by  Dr. Patsy Lager CXR: significant thoracic scoliosis.  Congestion in the right lung- bronchitis .  CHEST - 2 VIEW  Comparison:  None  Findings: No active infiltrate or effusion is seen. Mediastinal contours appear normal. The heart is within normal limits in size. Thoracolumbar scoliosis is noted.  IMPRESSION: No active lung disease. Thoracolumbar scoliosis.   Assessment and Plan: 1. Bronchitis  cefdinir (OMNICEF) 300 MG capsule  2. Cough  DG Chest 2 View  3. Malaise    4. Sinusitis  cefdinir (OMNICEF) 300 MG capsule   She is aware of her scoliosis and is seen by an orthopedist.  Treat for bronchitis with omnicef.  See patient instructions for more details.     Abbe Amsterdam, MD

## 2013-09-06 ENCOUNTER — Telehealth: Payer: Self-pay

## 2013-09-06 NOTE — Telephone Encounter (Signed)
LM for CB  HM reviewed. Due as noted: Tdap Flu Vaccine

## 2013-09-07 ENCOUNTER — Encounter: Payer: Self-pay | Admitting: Family Medicine

## 2013-09-07 ENCOUNTER — Ambulatory Visit (INDEPENDENT_AMBULATORY_CARE_PROVIDER_SITE_OTHER): Payer: BC Managed Care – PPO | Admitting: Family Medicine

## 2013-09-07 VITALS — BP 110/70 | HR 96 | Temp 98.0°F | Resp 16 | Ht 65.0 in | Wt 153.4 lb

## 2013-09-07 DIAGNOSIS — Z0000 Encounter for general adult medical examination without abnormal findings: Secondary | ICD-10-CM

## 2013-09-07 MED ORDER — CYCLOBENZAPRINE HCL 10 MG PO TABS: 10.0000 mg | ORAL_TABLET | Freq: Three times a day (TID) | ORAL | Status: DC | PRN

## 2013-09-07 NOTE — Telephone Encounter (Signed)
Unable to reach prior to visit  

## 2013-09-07 NOTE — Progress Notes (Signed)
  Subjective:    Patient ID: Crystal Sims, female    DOB: 05-18-1981, 32 y.o.   MRN: [id number]  HPI CPE- no concerns today.  UTD on GYN   Review of Systems Patient reports no vision/ hearing changes, adenopathy,fever, weight change,  persistant/recurrent hoarseness , swallowing issues, chest pain, palpitations, edema, persistant/recurrent cough, hemoptysis, dyspnea (rest/exertional/paroxysmal nocturnal), gastrointestinal bleeding (melena, rectal bleeding), abdominal pain, significant heartburn, bowel changes, GU symptoms (dysuria, hematuria, incontinence), Gyn symptoms (abnormal  bleeding, pain),  syncope, focal weakness, memory loss, numbness & tingling, skin/hair/nail changes, abnormal bruising or bleeding, anxiety, or depression.     Objective:   Physical Exam General Appearance:    Alert, cooperative, no distress, appears stated age  Head:    Normocephalic, without obvious abnormality, atraumatic  Eyes:    PERRL, conjunctiva/corneas clear, EOM's intact, fundi    benign, both eyes  Ears:    Normal TM's and external ear canals, both ears  Nose:   Nares normal, septum midline, mucosa normal, no drainage    or sinus tenderness  Throat:   Lips, mucosa, and tongue normal; teeth and gums normal  Neck:   Supple, symmetrical, trachea midline, no adenopathy;    Thyroid: no enlargement/tenderness/nodules  Back:     Symmetric, no curvature, ROM normal, no CVA tenderness  Lungs:     Clear to auscultation bilaterally, respirations unlabored  Chest Wall:    No tenderness or deformity   Heart:    Regular rate and rhythm, S1 and S2 normal, no murmur, rub   or gallop  Breast Exam:    Deferred to GYN  Abdomen:     Soft, non-tender, bowel sounds active all four quadrants,    no masses, no organomegaly  Genitalia:    Deferred to GYN  Rectal:    Extremities:   Extremities normal, atraumatic, no cyanosis or edema  Pulses:   2+ and symmetric all extremities  Skin:   Skin color, texture, turgor  normal, no rashes or lesions  Lymph nodes:   Cervical, supraclavicular, and axillary nodes normal  Neurologic:   CNII-XII intact, normal strength, sensation and reflexes    throughout          Assessment & Plan:

## 2013-09-07 NOTE — Patient Instructions (Signed)
Follow up in 1 year or as needed We'll notify you of your lab results and make any changes if needed Keep up the good work!  You look great!! Call with any questions or concerns Happy Fall!!! 

## 2013-09-08 LAB — LIPID PANEL
Cholesterol: 165 mg/dL (ref 0–200)
LDL Cholesterol: 98 mg/dL (ref 0–99)
Total CHOL/HDL Ratio: 3
VLDL: 18.4 mg/dL (ref 0.0–40.0)

## 2013-09-08 LAB — HEPATIC FUNCTION PANEL
ALT: 14 U/L (ref 0–35)
AST: 20 U/L (ref 0–37)
Albumin: 4 g/dL (ref 3.5–5.2)
Alkaline Phosphatase: 35 U/L — ABNORMAL LOW (ref 39–117)
Bilirubin, Direct: 0 mg/dL (ref 0.0–0.3)
Total Bilirubin: 0.5 mg/dL (ref 0.3–1.2)
Total Protein: 7.1 g/dL (ref 6.0–8.3)

## 2013-09-08 LAB — BASIC METABOLIC PANEL
CO2: 26 mEq/L (ref 19–32)
Chloride: 104 mEq/L (ref 96–112)
Creatinine, Ser: 0.7 mg/dL (ref 0.4–1.2)
Glucose, Bld: 84 mg/dL (ref 70–99)
Potassium: 3.9 mEq/L (ref 3.5–5.1)
Sodium: 140 mEq/L (ref 135–145)

## 2013-09-08 LAB — CBC WITH DIFFERENTIAL/PLATELET
Basophils Relative: 0.8 % (ref 0.0–3.0)
Eosinophils Relative: 1 % (ref 0.0–5.0)
Lymphocytes Relative: 41.5 % (ref 12.0–46.0)
MCV: 89.2 fl (ref 78.0–100.0)
Monocytes Relative: 5.9 % (ref 3.0–12.0)
Neutrophils Relative %: 50.8 % (ref 43.0–77.0)
RBC: 3.75 Mil/uL — ABNORMAL LOW (ref 3.87–5.11)
WBC: 4.4 10*3/uL — ABNORMAL LOW (ref 4.5–10.5)

## 2013-09-11 LAB — VITAMIN D 1,25 DIHYDROXY
Vitamin D2 1, 25 (OH)2: <8 pg/mL
Vitamin D3 1, 25 (OH)2: 49 pg/mL

## 2013-09-11 NOTE — Assessment & Plan Note (Signed)
Pt's PE WNL.  UTD on GYN.  Check labs.  Anticipatory guidance provided.  

## 2013-10-06 ENCOUNTER — Other Ambulatory Visit: Payer: Self-pay

## 2014-06-28 ENCOUNTER — Ambulatory Visit (INDEPENDENT_AMBULATORY_CARE_PROVIDER_SITE_OTHER): Payer: BC Managed Care – PPO | Admitting: Emergency Medicine

## 2014-06-28 VITALS — BP 112/80 | HR 71 | Temp 98.0°F | Resp 16 | Ht 65.5 in | Wt 162.8 lb

## 2014-06-28 DIAGNOSIS — L739 Follicular disorder, unspecified: Secondary | ICD-10-CM

## 2014-06-28 DIAGNOSIS — L738 Other specified follicular disorders: Secondary | ICD-10-CM

## 2014-06-28 DIAGNOSIS — S335XXA Sprain of ligaments of lumbar spine, initial encounter: Secondary | ICD-10-CM

## 2014-06-28 DIAGNOSIS — L678 Other hair color and hair shaft abnormalities: Secondary | ICD-10-CM

## 2014-06-28 DIAGNOSIS — S39012A Strain of muscle, fascia and tendon of lower back, initial encounter: Secondary | ICD-10-CM

## 2014-06-28 DIAGNOSIS — M4120 Other idiopathic scoliosis, site unspecified: Secondary | ICD-10-CM

## 2014-06-28 MED ORDER — NAPROXEN SODIUM 550 MG PO TABS: 550.0000 mg | ORAL_TABLET | Freq: Two times a day (BID) | ORAL | Status: DC

## 2014-06-28 MED ORDER — SULFAMETHOXAZOLE-TMP DS 800-160 MG PO TABS: 1.0000 | ORAL_TABLET | Freq: Two times a day (BID) | ORAL | Status: DC

## 2014-06-28 MED ORDER — CYCLOBENZAPRINE HCL 10 MG PO TABS: 10.0000 mg | ORAL_TABLET | Freq: Three times a day (TID) | ORAL | Status: DC | PRN

## 2014-06-28 NOTE — Patient Instructions (Signed)
Back Pain, Adult Low back pain is very common. About 1 in 5 people have back pain.The cause of low back pain is rarely dangerous. The pain often gets better over time.About half of people with a sudden onset of back pain feel better in just 2 weeks. About 8 in 10 people feel better by 6 weeks.  CAUSES Some common causes of back pain include:  Strain of the muscles or ligaments supporting the spine.  Wear and tear (degeneration) of the spinal discs.  Arthritis.  Direct injury to the back. DIAGNOSIS Most of the time, the direct cause of low back pain is not known.However, back pain can be treated effectively even when the exact cause of the pain is unknown.Answering your caregiver's questions about your overall health and symptoms is one of the most accurate ways to make sure the cause of your pain is not dangerous. If your caregiver needs more information, he or she may order lab work or imaging tests (X-rays or MRIs).However, even if imaging tests show changes in your back, this usually does not require surgery. HOME CARE INSTRUCTIONS For many people, back pain returns.Since low back pain is rarely dangerous, it is often a condition that people can learn to manageon their own.   Remain active. It is stressful on the back to sit or stand in one place. Do not sit, drive, or stand in one place for more than 30 minutes at a time. Take short walks on level surfaces as soon as pain allows.Try to increase the length of time you walk each day.  Do not stay in bed.Resting more than 1 or 2 days can delay your recovery.  Do not avoid exercise or work.Your body is made to move.It is not dangerous to be active, even though your back may hurt.Your back will likely heal faster if you return to being active before your pain is gone.  Pay attention to your body when you bend and lift. Many people have less discomfortwhen lifting if they bend their knees, keep the load close to their bodies,and  avoid twisting. Often, the most comfortable positions are those that put less stress on your recovering back.  Find a comfortable position to sleep. Use a firm mattress and lie on your side with your knees slightly bent. If you lie on your back, put a pillow under your knees.  Only take over-the-counter or prescription medicines as directed by your caregiver. Over-the-counter medicines to reduce pain and inflammation are often the most helpful.Your caregiver may prescribe muscle relaxant drugs.These medicines help dull your pain so you can more quickly return to your normal activities and healthy exercise.  Put ice on the injured area.  Put ice in a plastic bag.  Place a towel between your skin and the bag.  Leave the ice on for 15-20 minutes, 03-04 times a day for the first 2 to 3 days. After that, ice and heat may be alternated to reduce pain and spasms.  Ask your caregiver about trying back exercises and gentle massage. This may be of some benefit.  Avoid feeling anxious or stressed.Stress increases muscle tension and can worsen back pain.It is important to recognize when you are anxious or stressed and learn ways to manage it.Exercise is a great option. SEEK MEDICAL CARE IF:  You have pain that is not relieved with rest or medicine.  You have pain that does not improve in 1 week.  You have new symptoms.  You are generally not feeling well. SEEK   IMMEDIATE MEDICAL CARE IF:   You have pain that radiates from your back into your legs.  You develop new bowel or bladder control problems.  You have unusual weakness or numbness in your arms or legs.  You develop nausea or vomiting.  You develop abdominal pain.  You feel faint. Document Released: 11/17/2005 Document Revised: 05/18/2012 Document Reviewed: 03/21/2014 Exit[employer] Patient Information 2015 [employer]. This information is not intended to replace advice given to you by your health care provider. Make sure you  discuss any questions you have with your health care provider.  

## 2014-06-28 NOTE — Progress Notes (Signed)
[facility] [address], [place] [id number] [phone]  Date:  06/28/2014   Name:  Crystal Sims   DOB:  [birth year]   MRN:  [id number]  PCP:  Neena RhymesKatherine Tabori, MD    Chief Complaint: Back Pain and Spot on breast   History of Present Illness:  Crystal Sims is a 33 y.o. very pleasant female patient who presents with the following:  Low back pain for 3 1/2 weeks.  Non radiating.  No neuro complaint.  No history of injury or over use.  No GU or GYN symptoms.  Pain not constant.  Little pain today. Has a 'spot" on her left breast just superior to the areola.  Somewhat tender.  No drainage.  Not breast feeding.  No fever or chills. No improvement with over the counter medications or other home remedies. Denies other complaint or health concern today.   Patient Active Problem List   Diagnosis Date Noted  . Routine general medical examination at a health care facility 09/02/2012    Past Medical History  Diagnosis Date  . Chicken pox   . Seizures infancy    Noted only occured once with a fever during infancy  . Scoliosis   . Allergy     History reviewed. No pertinent past surgical history.  History  Substance Use Topics  . Smoking status: Never Smoker   . Smokeless tobacco: Not on file  . Alcohol Use: No    Family History  Problem Relation Age of Onset  . Hypertension Mother   . Diabetes Mother   . Hypertension Father   . Kidney disease Father   . Diabetes Father   . Hypertension Maternal Grandmother   . Diabetes Maternal Grandmother   . Cancer Maternal Grandfather   . Stroke Maternal Grandfather   . Hypertension Maternal Grandfather   . Diabetes Maternal Grandfather   . Arthritis Paternal Grandmother   . Hypertension Paternal Grandmother   . Diabetes Paternal Grandmother   . Hypertension Paternal Grandfather   . Diabetes Paternal Grandfather     No Known Allergies  Medication list has been reviewed and updated.  Current Outpatient  Prescriptions on File Prior to Visit  Medication Sig Dispense Refill  . cyclobenzaprine (FLEXERIL) 10 MG tablet Take 1 tablet (10 mg total) by mouth 3 (three) times daily as needed for muscle spasms.  30 tablet  0  . Fexofenadine HCl (ALLEGRA PO) Take 1 tablet by mouth daily.      . Multiple Vitamin (MULTIVITAMIN) tablet Take 1 tablet by mouth daily.       No current facility-administered medications on file prior to visit.    Review of Systems:  As per HPI, otherwise negative.    Physical Examination: Filed Vitals:   06/28/14 1314  BP: 112/80  Pulse: 71  Temp: 98 F (36.7 C)  Resp: 16   Filed Vitals:   06/28/14 1314  Height: 5' 5.5" (1.664 m)  Weight: 162 lb 12.8 oz (73.846 kg)   Body mass index is 26.67 kg/(m^2). Ideal Body Weight: Weight in (lb) to have BMI = 25: 152.2   GEN: WDWN, NAD, Non-toxic, Alert & Oriented x 3 HEENT: Atraumatic, Normocephalic.  Ears and Nose: No external deformity. EXTR: No clubbing/cyanosis/edema NEURO: Normal gait.  PSYCH: Normally interactive. Conversant. Not depressed or anxious appearing.  Calm demeanor.  LEFT breast:  Folliculitis. BACK: sacral tenderness.  Mild scoliosis  Assessment and Plan: Scoliosis Low back pain Folliculitis  Signed,  Phillips OdorJeffery Mithran Strike,  MD

## 2014-09-13 ENCOUNTER — Encounter: Payer: BC Managed Care – PPO | Admitting: Family Medicine

## 2014-12-12 ENCOUNTER — Ambulatory Visit
Admission: RE | Admit: 2014-12-12 | Discharge: 2014-12-12 | Disposition: A | Discharge status: Home / Self Care | Payer: BC Managed Care – PPO | Source: Ambulatory Visit | Attending: Allergy | Admitting: Allergy

## 2014-12-12 ENCOUNTER — Other Ambulatory Visit: Payer: Self-pay | Admitting: Allergy

## 2014-12-12 DIAGNOSIS — J321 Chronic frontal sinusitis: Secondary | ICD-10-CM

## 2014-12-12 DIAGNOSIS — J209 Acute bronchitis, unspecified: Secondary | ICD-10-CM

## 2015-02-12 ENCOUNTER — Encounter: Payer: BC Managed Care – PPO | Admitting: Family Medicine

## 2015-02-14 ENCOUNTER — Encounter: Payer: BC Managed Care – PPO | Admitting: Family Medicine

## 2015-02-17 IMAGING — CT CT MAXILLOFACIAL W/O CM
3 of 4 series · 15 of 47 positions shown, 18 images · non-contrast
Comparison: None.

CLINICAL DATA: 33-year-old female with sinusitis and congestion
with postnasal drip. Headache and cough for the past month. Recent
antibiotics and prednisone. Initial encounter.

EXAM:
CT MAXILLOFACIAL WITHOUT CONTRAST
TECHNIQUE: Multidetector CT imaging of the maxillofacial structures was
performed. Multiplanar CT image reconstructions were also generated.
A small metallic BB was placed on the right temple in order to
reliably differentiate right from left.

[Series 2: ax bone · axial · 0.33mm/px · z∈[-54,+64]mm · 9 of 55 slices shown, 12 images]
[im 4/55  brain]
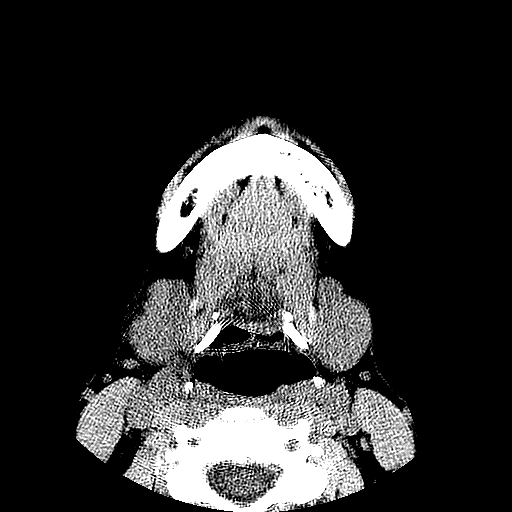
[im 4/55  bone]
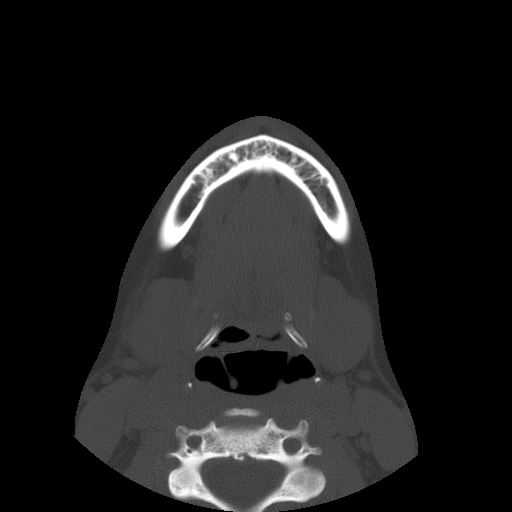
[im 12/55  bone]
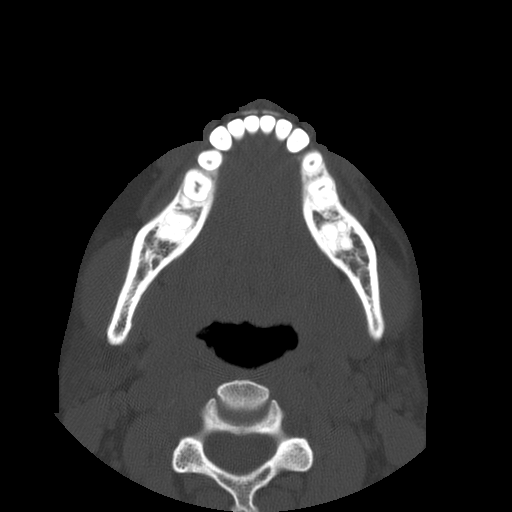
[im 16/55  bone]
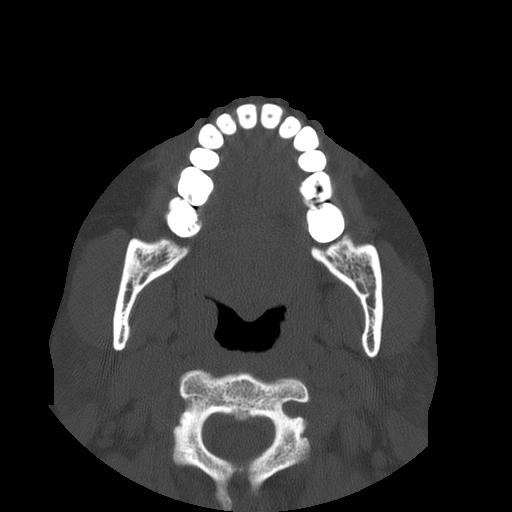
[im 24/55  bone]
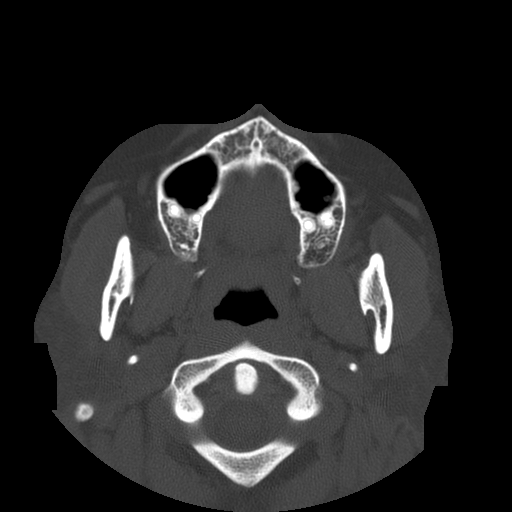
[im 28/55  brain]
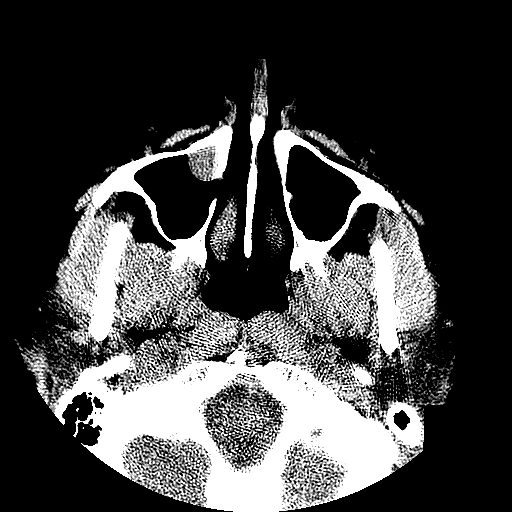
[im 28/55  bone]
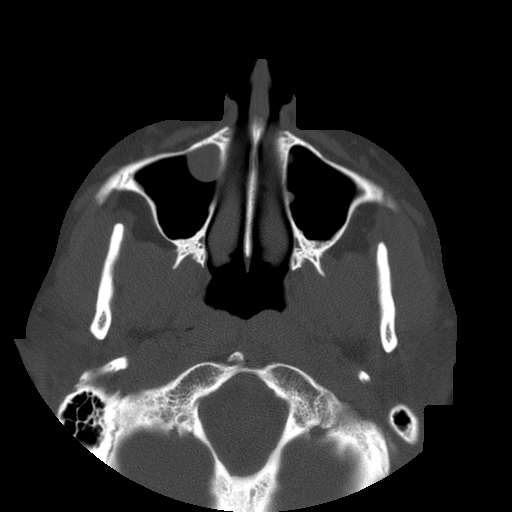
[im 31/55  bone]
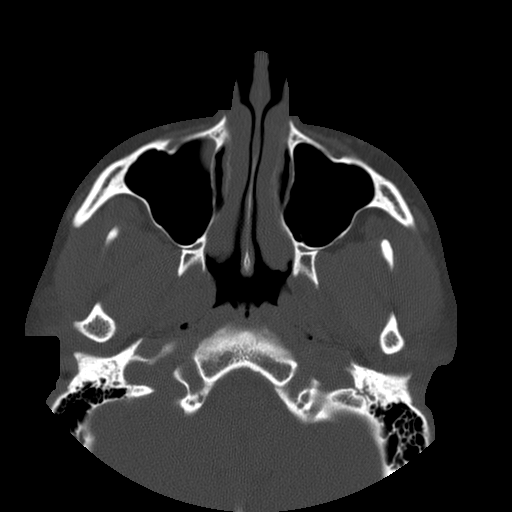
[im 39/55  bone]
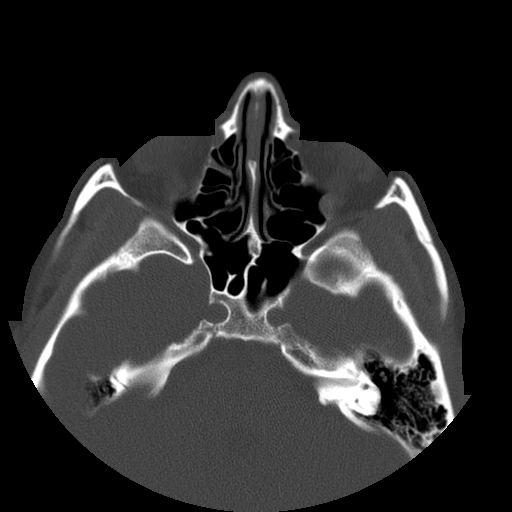
[im 43/55  bone]
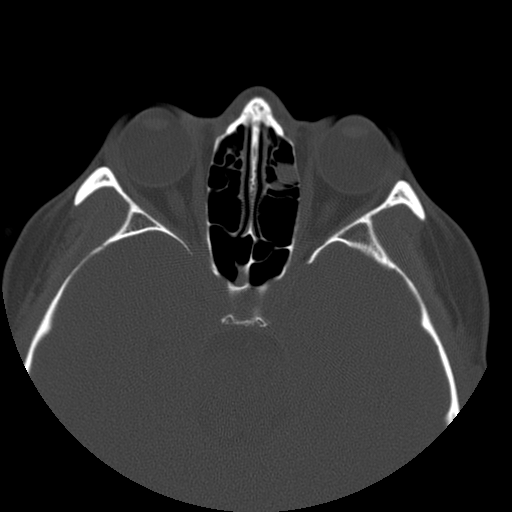
[im 51/55  brain]
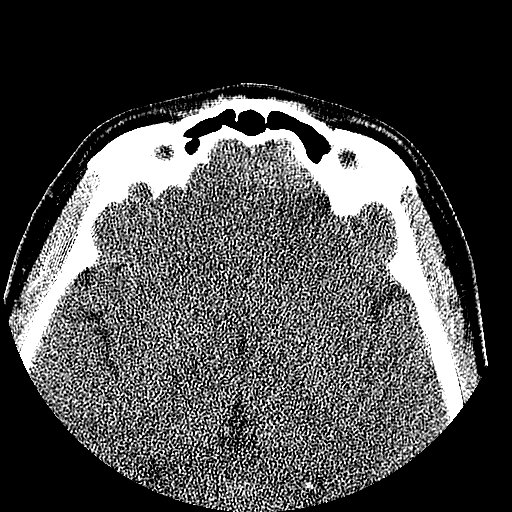
[im 51/55  bone]
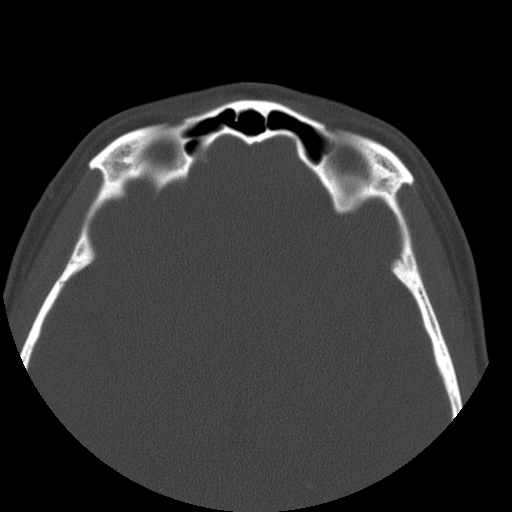

[Series 103: cor st · coronal · 0.33mm/px · 3 of 75 slices shown]
[im 25/75  bone]
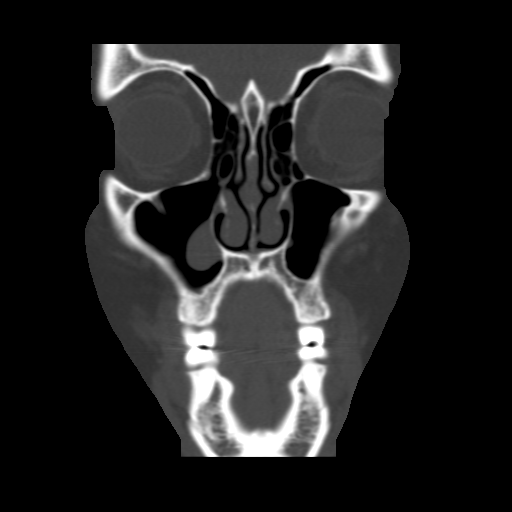
[im 33/75  bone]
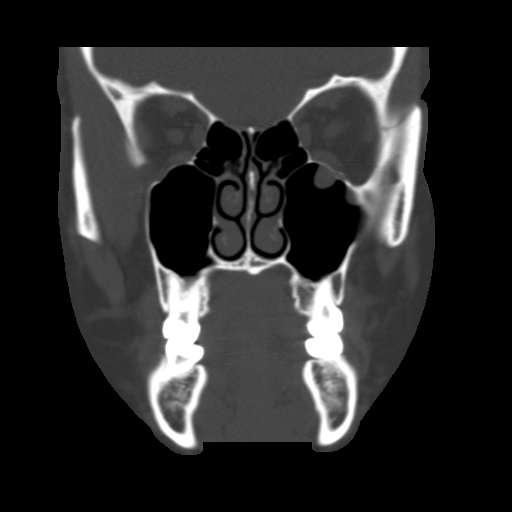
[im 42/75  bone]
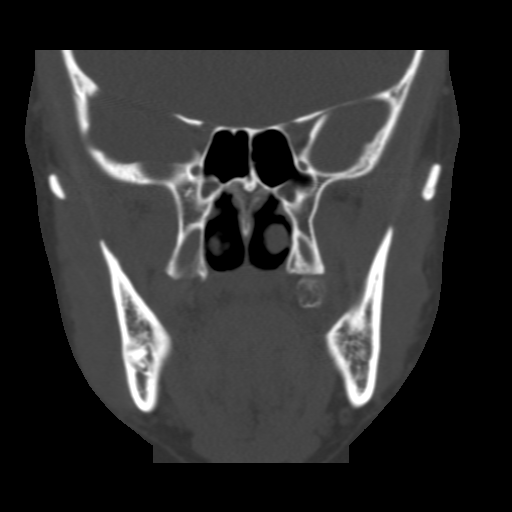

[Series 104: sag st · sagittal · 0.33mm/px · 3 of 83 slices shown]
[im 28/83  bone]
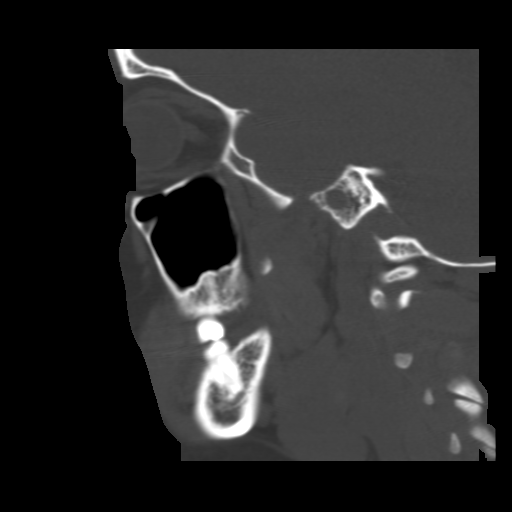
[im 42/83  bone]
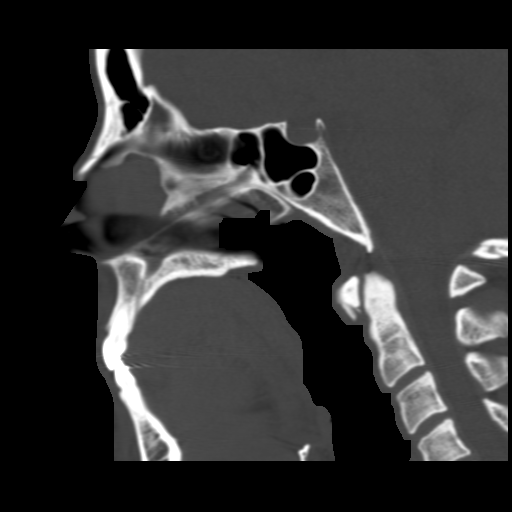
[im 55/83  bone]
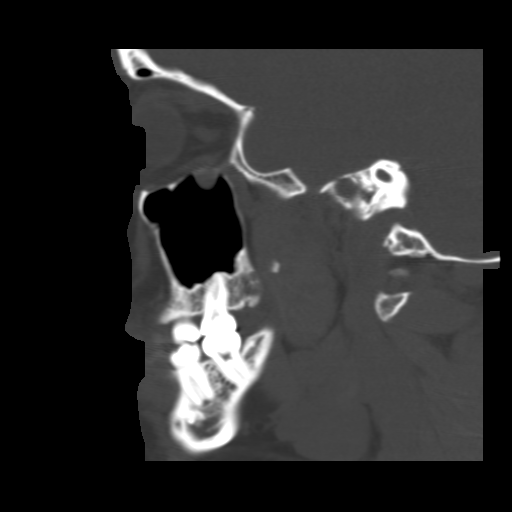

[15 of 47 positions shown; findings below may reference images not displayed]

FINDINGS: Polypoid opacification maxillary sinuses bilaterally. On the right,
largest polypoid structure spans over 14 mm (anterior medial
aspect). On the left, largest polypoid structure arises from the
roof of the left maxillary sinus measuring up to 8.6 mm. These may
represent retention cysts although true polyps not excluded.

Infundibulum patent bilaterally. Vertically directed right
infundibulum with more horizontal configuration of the left
infundibulum.

Nasal turbinates are fairly symmetric without polypoid
configuration.

Opacification mid to anterior ethmoid sinus air cells with remainder
of ethmoid sinus air cells clear.

Posterior medial right sphenoid sinus mucosal thickening measuring
up to 4 mm. Left sphenoid sinus is clear.

Frontal sinuses are clear. The very superior aspect of the frontal
sinuses was not imaged.

Visualized mastoid air cells and middle ear cavities are clear. The
bony cover of the superior semi circular canals is markedly thin or
possibly dehiscent (may be an incidental finding although has been
described in patients with vertigo).

Visualized intracranial structures and orbital structures are
unremarkable.
IMPRESSION: Polypoid opacification maxillary sinuses bilaterally as detailed
above may represent retention cysts although true polyps not
excluded.

Infundibulum patent bilaterally.

Opacification mid to anterior ethmoid sinus air cells with remainder
of ethmoid sinus air cells clear.

Posterior medial right sphenoid sinus mucosal thickening measuring
up to 4 mm. Left sphenoid sinus is clear.

Frontal sinuses are clear.

Visualized mastoid air cells and middle ear cavities are clear. The
bony cover of the superior semi circular canals is markedly thin or
possibly dehiscent (may be an incidental finding although has been
described in patients with vertigo).

## 2015-04-18 ENCOUNTER — Ambulatory Visit (INDEPENDENT_AMBULATORY_CARE_PROVIDER_SITE_OTHER): Payer: BC Managed Care – PPO | Admitting: Family Medicine

## 2015-04-18 ENCOUNTER — Encounter: Payer: Self-pay | Admitting: Family Medicine

## 2015-04-18 VITALS — BP 112/76 | HR 85 | Temp 98.1°F | Resp 16 | Wt 169.5 lb

## 2015-04-18 DIAGNOSIS — M542 Cervicalgia: Secondary | ICD-10-CM | POA: Diagnosis not present

## 2015-04-18 MED ORDER — METHOCARBAMOL 500 MG PO TABS: 500.0000 mg | ORAL_TABLET | Freq: Three times a day (TID) | ORAL | Status: DC | PRN

## 2015-04-18 MED ORDER — MELOXICAM 15 MG PO TABS: 15.0000 mg | ORAL_TABLET | Freq: Every day | ORAL | Status: DC

## 2015-04-18 NOTE — Assessment & Plan Note (Signed)
New to provider, recurrent problem for pt.  Reports flexeril provides relief but she is unable to work when taking the medication.  Start scheduled NSAIDs, heat.  Switch to Robaxin as this tends to be less drowsy.  Reviewed supportive care and red flags that should prompt return.  Pt expressed understanding and is in agreement w/ plan.

## 2015-04-18 NOTE — Progress Notes (Signed)
Pre visit review using our clinic review tool, if applicable. No additional management support is needed unless otherwise documented below in the visit note. 

## 2015-04-18 NOTE — Patient Instructions (Signed)
Follow up as scheduled Start the Mobic once daily for the inflammation Use the Robaxin as needed for spasm HEAT!!! Gentle stretching to avoid stiffness [facility] if interested Call with any questions or concerns Hang in there!! Happy Memorial Day!!!

## 2015-04-18 NOTE — Progress Notes (Signed)
   Subjective:    Patient ID: Theodoro KalataShauna Sachse, female    DOB: 05-Nov-1981, 34 y.o.   MRN: [id number]  HPI Neck pain- recurrent problem for pt.  This is stress related.  Pt reports sxs returned in January and have been 'off and on', typically occuring '4 days a week'.  Taking Flexeril but is unable to work when on medication.  No radiation of pain from neck into shoulders.  No weakness or numbness of arms or legs.   Review of Systems For ROS see HPI     Objective:   Physical Exam  Constitutional: She is oriented to person, place, and time. She appears well-developed and well-nourished. No distress.  HENT:  Head: Normocephalic and atraumatic.  Neck: Normal range of motion.  + trap spasm bilaterally  Cardiovascular: Intact distal pulses.   Lymphadenopathy:    She has no cervical adenopathy.  Neurological: She is alert and oriented to person, place, and time. She has normal reflexes. No cranial nerve deficit. Coordination normal.  Skin: Skin is warm and dry.  Psychiatric: She has a normal mood and affect. Her behavior is normal. Thought content normal.  Vitals reviewed.         Assessment & Plan:

## 2015-05-07 ENCOUNTER — Telehealth: Payer: Self-pay | Admitting: Family Medicine

## 2015-05-07 NOTE — Telephone Encounter (Signed)
Pre Visit letter sent  °

## 2015-05-17 ENCOUNTER — Ambulatory Visit (INDEPENDENT_AMBULATORY_CARE_PROVIDER_SITE_OTHER): Payer: BC Managed Care – PPO | Admitting: Medical

## 2015-05-17 ENCOUNTER — Encounter: Payer: Self-pay | Admitting: Medical

## 2015-05-17 VITALS — BP 125/81 | HR 89 | Temp 98.1°F | Ht 65.5 in | Wt 173.0 lb

## 2015-05-17 DIAGNOSIS — J029 Acute pharyngitis, unspecified: Secondary | ICD-10-CM | POA: Diagnosis not present

## 2015-05-17 DIAGNOSIS — J3089 Other allergic rhinitis: Secondary | ICD-10-CM

## 2015-05-17 DIAGNOSIS — L739 Follicular disorder, unspecified: Secondary | ICD-10-CM | POA: Insufficient documentation

## 2015-05-17 DIAGNOSIS — J309 Allergic rhinitis, unspecified: Secondary | ICD-10-CM | POA: Insufficient documentation

## 2015-05-17 LAB — POCT RAPID STREP A (OFFICE): RAPID STREP A SCREEN: NEGATIVE

## 2015-05-17 MED ORDER — CEPHALEXIN 500 MG PO CAPS: 500.0000 mg | ORAL_CAPSULE | Freq: Two times a day (BID) | ORAL | Status: DC

## 2015-05-17 MED ORDER — FLUTICASONE PROPIONATE 50 MCG/ACT NA SUSP: 2.0000 | Freq: Every day | NASAL | Status: DC

## 2015-05-17 NOTE — Assessment & Plan Note (Addendum)
By appearance will treat you for strep. We did rapid test today. Rapid results were negative but treating based on clinical presentation.  Will rx cephalexin.

## 2015-05-17 NOTE — Patient Instructions (Addendum)
Acute pharyngitis By appearance will treat you for strep. We did rapid test today. Rapid results were negative but treating based on clinical presentation.  Will rx cephalexin.  Folliculitis On face with some acne appearance. Rx keflex. See derm tomorrow since you have appointment. Can get advisement with derm if should stay on retin-a.  Allergic rhinitis Some allergy sympotms recent. Rx flonase. You may have sinus infectin. Cephalexin antibiotic would help with this if were the case.    Follow up in 7 days if any persisting signs or symptoms or as needed.

## 2015-05-17 NOTE — Assessment & Plan Note (Signed)
On face with some acne appearance. Rx keflex. See derm tomorrow since you have appointment. Can get advisement with derm if should stay on retin-a.

## 2015-05-17 NOTE — Assessment & Plan Note (Signed)
Some allergy sympotms recent. Rx flonase. You may have sinus infectin. Cephalexin antibiotic would help with this if were the case.

## 2015-05-17 NOTE — Progress Notes (Signed)
Pre visit review using our clinic review tool, if applicable. No additional management support is needed unless otherwise documented below in the visit note. 

## 2015-05-17 NOTE — Progress Notes (Signed)
Subjective:    Patient ID: Crystal Sims, female    DOB: 1980/12/12, 34 y.o.   MRN: [id number]  HPI  Pt has recent sinus pressure, pnd for about one week. Tuesday felt tired. Wed some chills at night. At midnight was sweating. Pt took ibuprofen and sweating stopped. This am face broke out.   Some sneezing, itching eyes and runny nose as well.  Pt has appointment with dermatologist. She uses Retin-a.  Yesterday some moderate sore throat hurt to swallow moderate.  LMP- 5 days ago.    Review of Systems  Constitutional: Positive for fever, chills and diaphoresis. Negative for fatigue.  HENT: Positive for congestion, sinus pressure, sneezing and sore throat. Negative for ear pain.   Eyes: Positive for itching.  Respiratory: Negative for cough, choking, chest tightness, shortness of breath and wheezing.   Cardiovascular: Negative for chest pain and palpitations.  Gastrointestinal: Negative for abdominal pain.  Skin:       Follicles inflamed and acne on face.  Neurological: Negative for dizziness and headaches.  Hematological: Negative for adenopathy. Does not bruise/bleed easily.  Psychiatric/Behavioral: Negative for behavioral problems and confusion.     Past Medical History  Diagnosis Date  . Chicken pox   . Seizures infancy    Noted only occured once with a fever during infancy  . Scoliosis   . Allergy     History   Social History  . Marital Status: Single    Spouse Name: N/A  . Number of Children: N/A  . Years of Education: N/A   Occupational History  . Not on file.   Social History Main Topics  . Smoking status: Never Smoker   . Smokeless tobacco: Not on file  . Alcohol Use: No  . Drug Use: No  . Sexual Activity: Not on file   Other Topics Concern  . Not on file   Social History Narrative    No past surgical history on file.  Family History  Problem Relation Age of Onset  . Hypertension Mother   . Diabetes Mother   . Hypertension Father   .  Kidney disease Father   . Diabetes Father   . Hypertension Maternal Grandmother   . Diabetes Maternal Grandmother   . Cancer Maternal Grandfather   . Stroke Maternal Grandfather   . Hypertension Maternal Grandfather   . Diabetes Maternal Grandfather   . Arthritis Paternal Grandmother   . Hypertension Paternal Grandmother   . Diabetes Paternal Grandmother   . Hypertension Paternal Grandfather   . Diabetes Paternal Grandfather     No Known Allergies  Current Outpatient Prescriptions on File Prior to Visit  Medication Sig Dispense Refill  . Fexofenadine HCl (ALLEGRA PO) Take 1 tablet by mouth daily.    Marland Kitchen levocetirizine (XYZAL) 5 MG tablet   3  . meloxicam (MOBIC) 15 MG tablet Take 1 tablet (15 mg total) by mouth daily. 30 tablet 1  . methocarbamol (ROBAXIN) 500 MG tablet Take 1 tablet (500 mg total) by mouth every 8 (eight) hours as needed for muscle spasms. 30 tablet 1  . montelukast (SINGULAIR) 10 MG tablet   3  . Multiple Vitamin (MULTIVITAMIN) tablet Take 1 tablet by mouth daily.    Marland Kitchen omeprazole (PRILOSEC) 40 MG capsule   3  . spironolactone (ALDACTONE) 100 MG tablet   2  . tretinoin (RETIN-A) 0.025 % cream   12   No current facility-administered medications on file prior to visit.    BP 125/81 mmHg  Pulse 89  Temp(Src) 98.1 F (36.7 C) (Oral)  Ht 5' 5.5" (1.664 m)  Wt 173 lb (78.472 kg)  BMI 28.34 kg/m2  SpO2 98%  LMP 05/13/2015       Objective:   Physical Exam  General  Mental Status - Alert. General Appearance - Well groomed. Not in acute distress.  HEENT Head- Normal. Ear Auditory Canal - Left- Normal. Right - Normal.Tympanic Membrane- Left- Normal. Right- Normal. Eye Sclera/Conjunctiva- Left- Normal. Right- Normal. Nose & Sinuses Nasal Mucosa- Left-  Boggy and Congested. Right-  Boggy and  Congested.Bilateral no  maxillary and  No frontal sinus pressure. Mouth & Throat Lips: Upper Lip- Normal: no dryness, cracking, pallor, cyanosis, or vesicular  eruption. Lower Lip-Normal: no dryness, cracking, pallor, cyanosis or vesicular eruption. Buccal Mucosa- Bilateral- No Aphthous ulcers. Oropharynx- No Discharge or Erythema. Tonsils: Characteristics- Bilateral- Erythema +  Congestion. Size/Enlargement- Bilateral- 2+ enlargement. Discharge- bilateral-None.  Neck Neck- Supple. No Masses.faint sub mandibular  lymphadenopathy.   Chest and Lung Exam Auscultation: Breath Sounds:-Clear even and unlabored.  Cardiovascular Auscultation:Rythm- Regular, rate and rhythm. Murmurs & Other Heart Sounds:Ausculatation of the heart reveal- No Murmurs.  Lymphatic Head & Neck General Head & Neck Lymphatics: Bilateral: Description- No Localized lymphadenopathy.  Derm- scattered folliculitis with some acne on face.       Assessment & Plan:

## 2015-05-18 ENCOUNTER — Encounter (HOSPITAL_COMMUNITY): Payer: Self-pay | Admitting: *Deleted

## 2015-05-18 ENCOUNTER — Emergency Department (HOSPITAL_COMMUNITY)
Admission: EM | Admit: 2015-05-18 | Discharge: 2015-05-18 | Disposition: A | Discharge status: Home / Self Care | Payer: BC Managed Care – PPO | Attending: Emergency Medicine | Admitting: Emergency Medicine

## 2015-05-18 DIAGNOSIS — B084 Enteroviral vesicular stomatitis with exanthem: Secondary | ICD-10-CM | POA: Insufficient documentation

## 2015-05-18 DIAGNOSIS — M419 Scoliosis, unspecified: Secondary | ICD-10-CM | POA: Insufficient documentation

## 2015-05-18 DIAGNOSIS — Z79899 Other long term (current) drug therapy: Secondary | ICD-10-CM | POA: Diagnosis not present

## 2015-05-18 DIAGNOSIS — Z791 Long term (current) use of non-steroidal anti-inflammatories (NSAID): Secondary | ICD-10-CM | POA: Insufficient documentation

## 2015-05-18 DIAGNOSIS — Z8709 Personal history of other diseases of the respiratory system: Secondary | ICD-10-CM | POA: Diagnosis not present

## 2015-05-18 DIAGNOSIS — R21 Rash and other nonspecific skin eruption: Secondary | ICD-10-CM | POA: Diagnosis present

## 2015-05-18 HISTORY — DX: Chronic sinusitis, unspecified: J32.9

## 2015-05-18 HISTORY — DX: Other allergy status, other than to drugs and biological substances: Z91.09

## 2015-05-18 MED ORDER — IBUPROFEN 600 MG PO TABS: 600.0000 mg | ORAL_TABLET | Freq: Four times a day (QID) | ORAL | Status: DC | PRN

## 2015-05-18 MED ORDER — PREDNISONE 20 MG PO TABS: 40.0000 mg | ORAL_TABLET | Freq: Every day | ORAL | Status: DC

## 2015-05-18 MED ORDER — HYDROXYZINE HCL 10 MG PO TABS: 10.0000 mg | ORAL_TABLET | Freq: Four times a day (QID) | ORAL | Status: DC | PRN

## 2015-05-18 MED ORDER — HYDROXYZINE HCL 25 MG PO TABS
25.0000 mg | ORAL_TABLET | Freq: Once | ORAL | Status: AC
  Administered 2015-05-18: 25 mg via ORAL
  Filled 2015-05-18: qty 1

## 2015-05-18 MED ORDER — PREDNISONE 20 MG PO TABS
60.0000 mg | ORAL_TABLET | Freq: Once | ORAL | Status: AC
  Administered 2015-05-18: 60 mg via ORAL
  Filled 2015-05-18: qty 3

## 2015-05-18 MED ORDER — SUCRALFATE 1 GM/10ML PO SUSP: 1.0000 g | Freq: Three times a day (TID) | ORAL | Status: DC

## 2015-05-18 NOTE — Discharge Instructions (Signed)

## 2015-05-18 NOTE — ED Provider Notes (Signed)
CSN: [id number]     Arrival date & time 05/18/15  2109 History  This chart was scribed for non-physician provider Antony Madura, PA-C, working with Purvis Sheffield, MD by Phillis Haggis, ED Scribe. This patient was seen in room WTR8/WTR8 and patient care was started at 9:41 PM.   Chief Complaint  Patient presents with  . Rash   The history is provided by the patient. No language interpreter was used.    HPI Comments: Crystal Sims is a 34 y.o. female who presents to the Emergency Department complaining of hand, feet, and mouth rash onset 2 days ago. She reports that she has bad allergies with postnasal drip and sore throat and first thought that her symptoms were attributed to that. She states that on Wednesday she started having a fever, tmax 101.3, chills, fatigue, decreased appetite, sore throat, myalgias, foot tingling and diaphoresis. She reports that the bumps on her hands and feet are painful but the rash around her mouth are not. She states that she has been putting a cold compress on her hands to some relief. She reports some rash on her abdomen and left shoulder that will occasionally itch. She reports that she is able to drink water, but has been unable to eat due to pain. She states that she saw her PCP and was told that she had strep, but the strep screen came back negative. She states that she also saw her dermatologist and was told to wait until next week to know the results of her culture swab, but was diagnosed with folliculitis on the abdomen and shoulder. She states that she was given doxycycline; states that she has been taking it with benadryl and anti-biotic cream to no relief. 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    Past Medical History  Diagnosis Date  . Chicken pox   . Seizures infancy    Noted only occured once with a fever during infancy  . Scoliosis   . Allergy   . Sinusitis     recurrent  . Environmental allergies    History reviewed. No pertinent past surgical  history. Family History  Problem Relation Age of Onset  . Hypertension Mother   . Diabetes Mother   . Hypertension Father   . Kidney disease Father   . Diabetes Father   . Hypertension Maternal Grandmother   . Diabetes Maternal Grandmother   . Cancer Maternal Grandfather   . Stroke Maternal Grandfather   . Hypertension Maternal Grandfather   . Diabetes Maternal Grandfather   . Arthritis Paternal Grandmother   . Hypertension Paternal Grandmother   . Diabetes Paternal Grandmother   . Hypertension Paternal Grandfather   . Diabetes Paternal Grandfather    History  Substance Use Topics  . Smoking status: Never Smoker   . Smokeless tobacco: Not on file  . Alcohol Use: No   OB History    No data available     Review of Systems  Constitutional: Positive for fever, chills, diaphoresis, appetite change and fatigue.  HENT: Positive for sore throat.   Musculoskeletal: Positive for myalgias.  Skin: Positive for rash.  All other systems reviewed and are negative.  Allergies  Review of patient's allergies indicates no known allergies.  Home Medications   Prior to Admission medications   Medication Sig Start Date End Date Taking? Authorizing Provider  cephALEXin (KEFLEX) 500 MG capsule Take 1 capsule (500 mg total) by mouth 2 (two) times daily. 05/17/15   Ramon Dredge Saguier, PA-C  Fexofenadine  HCl (ALLEGRA PO) Take 1 tablet by mouth daily.    Historical Provider, MD  fluticasone (FLONASE) 50 MCG/ACT nasal spray Place 2 sprays into both nostrils daily. 05/17/15   Ramon Dredge Saguier, PA-C  hydrOXYzine (ATARAX/VISTARIL) 10 MG tablet Take 1 tablet (10 mg total) by mouth every 6 (six) hours as needed for itching. 05/18/15   Antony Madura, PA-C  ibuprofen (ADVIL,MOTRIN) 600 MG tablet Take 1 tablet (600 mg total) by mouth every 6 (six) hours as needed. 05/18/15   Antony Madura, PA-C  levocetirizine (XYZAL) 5 MG tablet  03/10/15   Historical Provider, MD  meloxicam (MOBIC) 15 MG tablet Take 1 tablet (15 mg  total) by mouth daily. 04/18/15   Sheliah Hatch, MD  methocarbamol (ROBAXIN) 500 MG tablet Take 1 tablet (500 mg total) by mouth every 8 (eight) hours as needed for muscle spasms. 04/18/15   Sheliah Hatch, MD  montelukast (SINGULAIR) 10 MG tablet  03/10/15   Historical Provider, MD  Multiple Vitamin (MULTIVITAMIN) tablet Take 1 tablet by mouth daily.    Historical Provider, MD  omeprazole (PRILOSEC) 40 MG capsule  03/15/15   Historical Provider, MD  predniSONE (DELTASONE) 20 MG tablet Take 2 tablets (40 mg total) by mouth daily. Take 40 mg by mouth daily for 3 days, then  by mouth daily for 3 days, then  daily for 3 days 05/18/15   Antony Madura, PA-C  spironolactone (ALDACTONE) 100 MG tablet  03/15/15   Historical Provider, MD  sucralfate (CARAFATE) 1 GM/10ML suspension Take 10 mLs (1 g total) by mouth 4 (four) times daily -  with meals and at bedtime. 05/18/15   Antony Madura, PA-C  tretinoin (RETIN-A) 0.025 % cream  02/06/15   Historical Provider, MD   BP 137/94 mmHg  Pulse 79  Temp(Src) 98.5 F (36.9 C) (Oral)  Resp 18  SpO2 100%  LMP 05/13/2015  Physical Exam  Constitutional: She is oriented to person, place, and time. She appears well-developed and well-nourished. No distress.  HENT:  Head: Normocephalic and atraumatic.  Mouth/Throat: No oropharyngeal exudate.  Posterior oropharyngeal erythema noted with ulcerations in the posterior oropharynx. Mild tonsillar enlargement bilaterally. Uvula midline. Patient tolerating secretions without difficulty.  Eyes: Conjunctivae and EOM are normal. No scleral icterus.  Neck: Normal range of motion.  No nuchal rigidity or meningismus  Cardiovascular: Normal rate, regular rhythm and intact distal pulses.   Pulmonary/Chest: Effort normal. No respiratory distress.  Respirations even and unlabored  Musculoskeletal: Normal range of motion.  Neurological: She is alert and oriented to person, place, and time. She exhibits normal muscle tone.  Coordination normal.  Skin: Skin is warm and dry. Rash noted. She is not diaphoretic. No erythema. No pallor.  Papular rash noted around mouth, to b/l hands, and to sole of L foot; little rash noted to R foot. No weeping, drainage, or skin peeling. No pustules or vesicles present.  Psychiatric: She has a normal mood and affect. Her behavior is normal.  Nursing note and vitals reviewed.   ED Course  Procedures (including critical care time) DIAGNOSTIC STUDIES: Oxygen Saturation is 100% on RA, normal by my interpretation.    COORDINATION OF CARE: 9:48 PM-Discussed treatment plan which includes atarax, ibuprofen, benadryl, fluids and work note with pt at bedside and pt agreed to plan.   Labs Review Labs Reviewed - No data to display  Imaging Review No results found.   EKG Interpretation None      MDM   Final diagnoses:  Hand,  foot and mouth disease    34 year old female presents to the emergency department for further evaluation of a rash. Symptoms have been worsening over the past 2 days. Physical exam findings consistent with hand-foot-and-mouth disease. 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 Patient afebrile and hemodynamically stable. Have advised supportive treatment as outpatient with Benadryl and Vistaril. Patient given Carafate and ibuprofen as well for symptoms. Primary care follow up advised and return precautions given. Patient agreeable to plan with no unaddressed concerns. Patient discharged in good condition.  I personally performed the services described in this documentation, which was scribed in my presence. The recorded information has been reviewed and is accurate.   Filed Vitals:   05/18/15 2123  BP: 137/94  Pulse: 79  Temp: 98.5 F (36.9 C)  TempSrc: Oral  Resp: 18  SpO2: 100%      Antony Madura, PA-C 05/19/15 0020  Purvis Sheffield, MD 05/19/15 1000

## 2015-05-18 NOTE — ED Notes (Signed)
Pt states that she had sinus drainage and congestion a few days ago and broke out in a rash; pt saw her PCP yesterday and was diagnosed with Pharyngitis and Folliculitis; pt states that she saw her dermatologist today and she thinks its a bacterial infection and that the rash is just her body reacting to the stress; pt is on Doxycycline that was prescribed today (PCP had prescribed Keflex and dermatologist advised to stop taking); pt states that the itching to her hands has become unbearable tonight and is requesting something for the itching; pt states that that rash has been there for 2 days; pt has been taking Benadryl and Hydrocortisone cream without improvement in the itching

## 2015-05-25 ENCOUNTER — Encounter: Payer: BC Managed Care – PPO | Admitting: Family Medicine

## 2015-05-28 ENCOUNTER — Encounter: Payer: Self-pay | Admitting: Family Medicine

## 2015-05-28 ENCOUNTER — Ambulatory Visit (INDEPENDENT_AMBULATORY_CARE_PROVIDER_SITE_OTHER): Payer: BC Managed Care – PPO | Admitting: Family Medicine

## 2015-05-28 VITALS — BP 122/82 | HR 94 | Temp 98.0°F | Resp 16 | Wt 173.0 lb

## 2015-05-28 DIAGNOSIS — B084 Enteroviral vesicular stomatitis with exanthem: Secondary | ICD-10-CM | POA: Insufficient documentation

## 2015-05-28 MED ORDER — SILVER SULFADIAZINE 1 % EX CREA: 1.0000 "application " | TOPICAL_CREAM | Freq: Every day | CUTANEOUS | Status: DC

## 2015-05-28 MED ORDER — PREDNISONE 10 MG PO TABS: ORAL_TABLET | ORAL | Status: DC

## 2015-05-28 MED ORDER — TRIAMCINOLONE ACETONIDE 0.1 % EX OINT: 1.0000 "application " | TOPICAL_OINTMENT | Freq: Two times a day (BID) | CUTANEOUS | Status: DC

## 2015-05-28 MED ORDER — HYDROXYZINE HCL 25 MG PO TABS: 25.0000 mg | ORAL_TABLET | Freq: Three times a day (TID) | ORAL | Status: DC | PRN

## 2015-05-28 NOTE — Patient Instructions (Signed)
Follow up as needed- definitely if no improvement Restart the oral prednisone- take w/ food Use the triamcinolone ointment on the hands/feet daily Use the Silvadene cream on the hands/feet nightly Continue to use the hydroxyzine as needed for itching- may cause drowsiness You are no longer contagious Call with any questions or concerns Hang in there!!!

## 2015-05-28 NOTE — Progress Notes (Signed)
Pre visit review using our clinic review tool, if applicable. No additional management support is needed unless otherwise documented below in the visit note. 

## 2015-05-28 NOTE — Progress Notes (Signed)
   Subjective:    Patient ID: Crystal Sims, female    DOB: 1981/02/25, 34 y.o.   MRN: [id number]  HPI ER f/u- pt was seen on 6/17 and dx'd w/ HFM.  Pt reports her hands and feet are now peeling.  Having burning pain of hands/feet.  Pt was tx'd w/ Prednisone, hydroxyzine, carafate.  Pt reports mouth lesion have resolved.  Her biggest concern is her peeling and painful hands and feet.   Review of Systems For ROS see HPI     Objective:   Physical Exam  Constitutional: She is oriented to person, place, and time. She appears well-developed and well-nourished. No distress.  HENT:  Head: Normocephalic and atraumatic.  Mouth/Throat: Oropharynx is clear and moist. No oropharyngeal exudate.  Eyes: Conjunctivae and EOM are normal. Pupils are equal, round, and reactive to light.  Neck: Normal range of motion. Neck supple.  Musculoskeletal: She exhibits edema (swelling and tightness of hands/feet bilaterally).  Lymphadenopathy:    She has no cervical adenopathy.  Neurological: She is alert and oriented to person, place, and time.  Skin: Skin is warm and dry. There is erythema (redness and swelling of hands and feet bilaterally w/ peeling, skin).  Psychiatric: She has a normal mood and affect. Her behavior is normal.  Vitals reviewed.         Assessment & Plan:

## 2015-06-04 NOTE — Assessment & Plan Note (Signed)
New to provider, ongoing for pt.  Was seen and dx'd in ER.  Pt's hands and feet are now sloughing off and extensively peeling.  Due to burning pain, will repeat oral prednisone taper, start triamcinolone ointment for daily use and silvadene ointment nightly as pt's hands resemble 2nd degree burns.  Reviewed supportive care and red flags that should prompt return.  Pt expressed understanding and is in agreement w/ plan.

## 2015-06-08 ENCOUNTER — Telehealth: Payer: Self-pay | Admitting: Family Medicine

## 2015-06-08 MED ORDER — HYDROXYZINE HCL 25 MG PO TABS: 25.0000 mg | ORAL_TABLET | Freq: Three times a day (TID) | ORAL | Status: DC | PRN

## 2015-06-08 NOTE — Telephone Encounter (Signed)
Caller name: Coree Relationship to patient: self Can be reached:[phone] [facility] [address]   Reason for call: wants refill

## 2015-06-08 NOTE — Telephone Encounter (Signed)
Ok for a refill on hydroxyzine? Pt was seen 05-28-15 for hand, foot and mouth?

## 2015-06-08 NOTE — Telephone Encounter (Signed)
Ok for refill, #45, no additional refills

## 2015-06-08 NOTE — Telephone Encounter (Signed)
Med filled.  

## 2015-11-05 ENCOUNTER — Ambulatory Visit (INDEPENDENT_AMBULATORY_CARE_PROVIDER_SITE_OTHER): Payer: BC Managed Care – PPO | Admitting: Family Medicine

## 2015-11-05 ENCOUNTER — Encounter: Payer: Self-pay | Admitting: Family Medicine

## 2015-11-05 VITALS — BP 122/82 | HR 91 | Temp 98.0°F | Resp 16 | Ht 66.0 in | Wt 186.4 lb

## 2015-11-05 DIAGNOSIS — R319 Hematuria, unspecified: Secondary | ICD-10-CM | POA: Diagnosis not present

## 2015-11-05 DIAGNOSIS — Z01419 Encounter for gynecological examination (general) (routine) without abnormal findings: Secondary | ICD-10-CM | POA: Diagnosis not present

## 2015-11-05 LAB — POCT URINALYSIS DIPSTICK
BILIRUBIN UA: NEGATIVE
Glucose, UA: NEGATIVE
Ketones, UA: NEGATIVE
NITRITE UA: NEGATIVE
PH UA: 6
PROTEIN UA: NEGATIVE
Spec Grav, UA: 1.025
Urobilinogen, UA: 0.2

## 2015-11-05 MED ORDER — CEPHALEXIN 500 MG PO CAPS: 500.0000 mg | ORAL_CAPSULE | Freq: Two times a day (BID) | ORAL | Status: AC

## 2015-11-05 NOTE — Progress Notes (Signed)
Pre visit review using our clinic review tool, if applicable. No additional management support is needed unless otherwise documented below in the visit note. 

## 2015-11-05 NOTE — Progress Notes (Signed)
   Subjective:    Patient ID: Crystal Sims, female    DOB: 26-Oct-1981, 34 y.o.   MRN: [id number]  HPI Blood on toilet tissue- pt reports that she had blood on toilet tissue for 2 days in late November.  This resolved.  Pt notes that she again had pink streak on tissue last week.  No indication of blood x2 days.  Increased urinary frequency.  Denies urgency.  No dysuria.     Review of Systems For ROS see HPI      Objective:   Physical Exam  Constitutional: She is oriented to person, place, and time. She appears well-developed and well-nourished. No distress.  HENT:  Head: Normocephalic and atraumatic.  Abdominal: Soft. She exhibits no distension. There is no tenderness (no suprapubic or CVA tenderness).  Neurological: She is alert and oriented to person, place, and time.  Skin: Skin is warm and dry.  Psychiatric: She has a normal mood and affect. Her behavior is normal.  Vitals reviewed.         Assessment & Plan:

## 2015-11-05 NOTE — Patient Instructions (Signed)
Follow up as needed Start the keflex twice daily for presumed UTI Drink plenty of fluids If you continue to notice pink tinge on the tissue- let me know Call with any questions or concerns If you want to join us at the new [place] office, any scheduled appointments will automatically transfer and we will see you at [address], [place], [place] [id number] (OPENING 12/04/15) Happy Holidays!!!

## 2015-11-06 ENCOUNTER — Encounter: Payer: Self-pay | Admitting: Family Medicine

## 2015-11-06 LAB — URINE CULTURE

## 2015-11-11 DIAGNOSIS — R319 Hematuria, unspecified: Secondary | ICD-10-CM | POA: Insufficient documentation

## 2015-11-11 NOTE — Assessment & Plan Note (Signed)
Pt's sxs and UA more consistent w/ UTI than vaginal bleeding.  Start abx.  If no improvement in bleeding sxs, will need GYN referral.  Pt expressed understanding and is in agreement w/ plan.

## 2015-11-24 ENCOUNTER — Other Ambulatory Visit: Payer: Self-pay | Admitting: Medical

## 2016-01-05 ENCOUNTER — Ambulatory Visit (INDEPENDENT_AMBULATORY_CARE_PROVIDER_SITE_OTHER): Payer: BC Managed Care – PPO | Admitting: Family Medicine

## 2016-01-05 VITALS — BP 106/70 | HR 88 | Temp 97.9°F | Resp 18 | Ht 65.0 in | Wt 184.0 lb

## 2016-01-05 DIAGNOSIS — R05 Cough: Secondary | ICD-10-CM | POA: Diagnosis not present

## 2016-01-05 DIAGNOSIS — R059 Cough, unspecified: Secondary | ICD-10-CM

## 2016-01-05 DIAGNOSIS — B349 Viral infection, unspecified: Secondary | ICD-10-CM

## 2016-01-05 DIAGNOSIS — J3489 Other specified disorders of nose and nasal sinuses: Secondary | ICD-10-CM

## 2016-01-05 MED ORDER — BENZONATATE 100 MG PO CAPS: 100.0000 mg | ORAL_CAPSULE | Freq: Three times a day (TID) | ORAL | Status: DC | PRN

## 2016-01-05 MED ORDER — HYDROCODONE-HOMATROPINE 5-1.5 MG/5ML PO SYRP: 5.0000 mL | ORAL_SOLUTION | ORAL | Status: DC | PRN

## 2016-01-05 MED ORDER — IPRATROPIUM BROMIDE 0.03 % NA SOLN: 2.0000 | Freq: Two times a day (BID) | NASAL | Status: DC

## 2016-01-05 NOTE — Patient Instructions (Addendum)
Drink plenty of fluids and get enough rest  Take the benzonatate cough pills one or 2 pills 3 times daily for daytime cough  Take the Hycodan cough syrup 1 teaspoon every 4-6 hours for cough. Best use at night. Can cause some daytime drowsiness.  Use the Atrovent (ipratropium) nose spray 1 or 2 sprays each nostril up to 4 times daily as needed for nasal congestion.  You can continue your Flonase  Return if worse. If you happen to feel too sick by Monday that you feel like he cannot work please contact me for an excuse.

## 2016-01-05 NOTE — Progress Notes (Signed)
Patient ID: Sheretta Grumbine, female    DOB: 11-Nov-1981  Age: 35 y.o. MRN: [id number]  Chief Complaint  Patient presents with  . Nasal Congestion    x 5 days  . 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 She has a persistent hacking cough. She does not smoke or drink. She does not have children of her own, but is around children at school. She has a history of allergies and has been on allergy medications and fluticasone nose spray.  Current allergies, medications, problem list, past/family and social histories reviewed.  Objective:  BP 106/70 mmHg  Pulse 88  Temp(Src) 97.9 F (36.6 C)  Resp 18  Ht  (1.651 m)  Wt 184 lb (83.462 kg)  BMI 30.62 kg/m2  SpO2 97%  LMP 01/03/2016 (Approximate)  No major acute distress. No fever. TMs are normal. Throat clear. Nose congested. Sinuses not particularly tender. Neck supple without major nodes. Chest is clear to auscultation. Heart regular.  Assessment & Plan:   Assessment: 1. Cough   2. Rhinorrhea   3. Viral syndrome       Plan: Viral syndrome with upper respiratory congestion and hacking cough. Will treat according to instructions  No orders of the defined types were placed in this encounter.    Meds ordered this encounter  Medications  . spironolactone (ALDACTONE) 100 MG tablet    Sig: Take 100 mg by mouth daily.  Marland Kitchen triamcinolone cream (KENALOG) 0.1 %    Sig: Apply 1 application topically 2 (two) times daily.  . Melatonin 10 MG TABS    Sig: Take by mouth.  . benzonatate (TESSALON) 100 MG capsule    Sig: Take 1-2 capsules (100-200 mg total) by mouth 3 (three) times daily as needed.    Dispense:  30 capsule    Refill:  0  . HYDROcodone-homatropine (HYCODAN) 5-1.5 MG/5ML syrup    Sig: Take 5 mLs by mouth every 4 (four) hours as needed.    Dispense:  120 mL    Refill:  0  . ipratropium (ATROVENT) 0.03 % nasal spray    Sig:  Place 2 sprays into both nostrils 2 (two) times daily.    Dispense:  30 mL    Refill:  0         Patient Instructions  Drink plenty of fluids and get enough rest  Take the benzonatate cough pills one or 2 pills 3 times daily for daytime cough  Take the Hycodan cough syrup 1 teaspoon every 4-6 hours for cough. Best use at night. Can cause some daytime drowsiness.  Use the Atrovent (ipratropium) nose spray 1 or 2 sprays each nostril up to 4 times daily as needed for nasal congestion.  You can continue your Flonase  Return if worse. If you happen to feel too sick by Monday that you feel like he cannot work please contact me for an excuse.     Return if symptoms worsen or fail to improve.   Giovoni Bunch, MD 01/05/2016

## 2016-08-20 ENCOUNTER — Ambulatory Visit (INDEPENDENT_AMBULATORY_CARE_PROVIDER_SITE_OTHER): Payer: BC Managed Care – PPO | Admitting: Family Medicine

## 2016-08-20 ENCOUNTER — Encounter: Payer: Self-pay | Admitting: Family Medicine

## 2016-08-20 VITALS — BP 112/72 | HR 76 | Temp 97.9°F | Resp 18 | Ht 65.0 in | Wt 179.5 lb

## 2016-08-20 DIAGNOSIS — J0100 Acute maxillary sinusitis, unspecified: Secondary | ICD-10-CM | POA: Diagnosis not present

## 2016-08-20 MED ORDER — AMOXICILLIN 875 MG PO TABS: 875.0000 mg | ORAL_TABLET | Freq: Two times a day (BID) | ORAL | 0 refills | Status: DC

## 2016-08-20 MED ORDER — GUAIFENESIN-CODEINE 100-10 MG/5ML PO SYRP: 10.0000 mL | ORAL_SOLUTION | Freq: Three times a day (TID) | ORAL | 0 refills | Status: DC | PRN

## 2016-08-20 NOTE — Progress Notes (Signed)
   Subjective:    Patient ID: Crystal KalataShauna Sims, female    DOB: December 22, 1980, 35 y.o.   MRN: [id number]  HPI URI- pt initially thought it was just seasonal allergies b/c it was itchy eyes, runny nose.  But now w/ facial pain, chest congestion, cough.  Nasal congestion but not able to get anything out when she blows her nose.  + chills overnight.  No documented fever.  sxs started Sunday.  + sick contacts.   Review of Systems For ROS see HPI     Objective:   Physical Exam  Constitutional: She appears well-developed and well-nourished. No distress.  HENT:  Head: Normocephalic and atraumatic.  Right Ear: Tympanic membrane normal.  Left Ear: Tympanic membrane normal.  Nose: Mucosal edema and rhinorrhea present. Right sinus exhibits maxillary sinus tenderness. Right sinus exhibits no frontal sinus tenderness. Left sinus exhibits no maxillary sinus tenderness and no frontal sinus tenderness.  Mouth/Throat: Uvula is midline and mucous membranes are normal. Posterior oropharyngeal erythema present. No oropharyngeal exudate.  Eyes: Conjunctivae and EOM are normal. Pupils are equal, round, and reactive to light.  Neck: Normal range of motion. Neck supple.  Cardiovascular: Normal rate, regular rhythm and normal heart sounds.   Pulmonary/Chest: Effort normal and breath sounds normal. No respiratory distress. She has no wheezes.  Lymphadenopathy:    She has no cervical adenopathy.  Vitals reviewed.         Assessment & Plan:  Maxillary sinusitis- new.  Pt's sxs and PE consistent w/ infxn.  Start abx.  Cough meds prn.  Reviewed supportive care and red flags that should prompt return.  Pt expressed understanding and is in agreement w/ plan.

## 2016-08-20 NOTE — Patient Instructions (Signed)
Follow up as needed Start the Amoxicillin twice daily- take w/ food Use Mucinex DM for daytime cough, prescription syrup for nights/weekends (will cause drowsiness) Drink plenty of fluids Continue your allergy medications Call with any questions or concerns Hang in there!!

## 2016-08-20 NOTE — Progress Notes (Signed)
Pre visit review using our clinic review tool, if applicable. No additional management support is needed unless otherwise documented below in the visit note. 

## 2017-01-28 LAB — HM PAP SMEAR

## 2017-10-28 ENCOUNTER — Ambulatory Visit: Payer: BC Managed Care – PPO | Admitting: Family Medicine

## 2017-10-28 ENCOUNTER — Other Ambulatory Visit: Payer: Self-pay

## 2017-10-28 ENCOUNTER — Encounter: Payer: Self-pay | Admitting: Family Medicine

## 2017-10-28 VITALS — BP 106/78 | HR 83 | Temp 98.3°F | Resp 16 | Ht 65.0 in | Wt 191.5 lb

## 2017-10-28 DIAGNOSIS — M940 Chondrocostal junction syndrome [Tietze]: Secondary | ICD-10-CM

## 2017-10-28 MED ORDER — PROMETHAZINE-DM 6.25-15 MG/5ML PO SYRP: 5.0000 mL | ORAL_SOLUTION | Freq: Four times a day (QID) | ORAL | 0 refills | Status: DC | PRN

## 2017-10-28 MED ORDER — MELOXICAM 15 MG PO TABS: 15.0000 mg | ORAL_TABLET | Freq: Every day | ORAL | 0 refills | Status: DC

## 2017-10-28 NOTE — Progress Notes (Signed)
   Subjective:    Patient ID: Crystal Sims, female    DOB: 02-11-1981, 36 y.o.   MRN: [id number]  HPI URI- pt notes chest and back discomfort starting yesterday after coughing for ~10 days.  + pain w/ cough.  No fever.  + frontal HA.  No ear pain.  Cough is not productive.  + hoarseness.  + sick contacts.  Hx of seasonal allergies- seeing Dr [place] CallasSharma.   Review of Systems For ROS see HPI     Objective:   Physical Exam  Constitutional: She is oriented to person, place, and time. She appears well-developed and well-nourished. No distress.  HENT:  Head: Normocephalic and atraumatic.  TMs normal bilaterally Mild nasal congestion Throat w/out erythema, edema, or exudate  Eyes: Conjunctivae and EOM are normal. Pupils are equal, round, and reactive to light.  Neck: Normal range of motion. Neck supple.  Cardiovascular: Normal rate, regular rhythm, normal heart sounds and intact distal pulses.  No murmur heard. Pulmonary/Chest: Effort normal and breath sounds normal. No respiratory distress. She has no wheezes. She exhibits tenderness (TTP over anterior chest and thoracic ribs posteriorly).  + hacking cough  Lymphadenopathy:    She has no cervical adenopathy.  Neurological: She is alert and oriented to person, place, and time.  Psychiatric: She has a normal mood and affect. Her behavior is normal. Thought content normal.          Assessment & Plan:  Costochondritis- new.  Pt's sxs and PE consistent w/ inflammation due to coughing from viral URI.  Lungs CTAB- no evidence of PNA or wheeze.  Start scheduled NSAIDs.  Cough meds prn.  Reviewed supportive care and red flags that should prompt return.  Pt expressed understanding and is in agreement w/ plan.

## 2017-10-28 NOTE — Patient Instructions (Signed)
Follow up as needed or as scheduled This appears to be a viral illness that is improving so no need for antibiotics START the Mobic once daily- take w/ food- for the pain and inflammation in your chest and back Use the cough syrup as needed- may cause drowsiness Drink plenty of fluids REST! Call with any questions or concerns Hang in there!!

## 2017-11-28 ENCOUNTER — Other Ambulatory Visit: Payer: Self-pay | Admitting: Family Medicine

## 2018-01-26 ENCOUNTER — Ambulatory Visit (INDEPENDENT_AMBULATORY_CARE_PROVIDER_SITE_OTHER): Payer: BC Managed Care – PPO

## 2018-01-26 ENCOUNTER — Encounter: Payer: Self-pay | Admitting: Emergency Medicine

## 2018-01-26 ENCOUNTER — Ambulatory Visit: Payer: BC Managed Care – PPO | Admitting: Emergency Medicine

## 2018-01-26 ENCOUNTER — Other Ambulatory Visit: Payer: Self-pay

## 2018-01-26 VITALS — BP 104/70 | HR 86 | Temp 98.0°F | Resp 16 | Ht 65.25 in | Wt 190.2 lb

## 2018-01-26 DIAGNOSIS — M79671 Pain in right foot: Secondary | ICD-10-CM | POA: Diagnosis not present

## 2018-01-26 NOTE — Patient Instructions (Addendum)
     IF you received an x-ray today, you will receive an invoice from [facility]. Please contact  Radiology at [phone] with questions or concerns regarding your invoice.   IF you received labwork today, you will receive an invoice from [facility]. Please contact [facility] at [phone] with questions or concerns regarding your invoice.   Our billing staff will not be able to assist you with questions regarding bills from these companies.  You will be contacted with the lab results as soon as they are available. The fastest way to get your results is to activate your My Chart account. Instructions are located on the last page of this paperwork. If you have not heard from us regarding the results in 2 weeks, please contact this office.     Foot Pain Many things can cause foot pain. Some common causes are:  An injury.  A sprain.  Arthritis.  Blisters.  Bunions.  Follow these instructions at home: Pay attention to any changes in your symptoms. Take these actions to help with your discomfort:  If directed, put ice on the affected area: ? Put ice in a plastic bag. ? Place a towel between your skin and the bag. ? Leave the ice on for 15-20 minutes, 3?4 times a day for 2 days.  Take over-the-counter and prescription medicines only as told by your health care provider.  Wear comfortable, supportive shoes that fit you well. Do not wear high heels.  Do not stand or walk for long periods of time.  Do not lift a lot of weight. This can put added pressure on your feet.  Do stretches to relieve foot pain and stiffness as told by your health care provider.  Rub your foot gently.  Keep your feet clean and dry.  Contact a health care provider if:  Your pain does not get better after a few days of self-care.  Your pain gets worse.  You cannot stand on your foot. Get help right away if:  Your foot is numb or tingling.  Your foot or toes are  swollen.  Your foot or toes turn white or blue.  You have warmth and redness along your foot. This information is not intended to replace advice given to you by your health care provider. Make sure you discuss any questions you have with your health care provider. Document Released: 12/14/2015 Document Revised: 04/24/2016 Document Reviewed: 12/13/2014 [employer] Interactive Patient Education  2018 [employer]  

## 2018-01-26 NOTE — Progress Notes (Signed)
Crystal Sims 37 y.o.   Chief Complaint  Patient presents with  . Foot Pain    RIGHT per patient started this morning    HISTORY OF PRESENT ILLNESS: This is a 37 y.o. female complaining of pain to the right foot since this morning.  Denies injury or any other significant symptoms. HPI   Prior to Admission medications   Medication Sig Start Date End Date Taking? Authorizing Provider  BREO ELLIPTA 100-25 MCG/INH AEPB 1 PUFF ONCE A DAY INHALATION 30 DAYS 07/15/16  Yes [provider]  fluticasone (FLONASE) 50 MCG/ACT nasal spray Place 2 sprays into both nostrils daily. 10/12/17  Yes [provider]  ipratropium (ATROVENT) 0.03 % nasal spray Place 2 sprays into both nostrils 2 (two) times daily. 01/05/16  Yes Peyton NajjarHopper, David H, MD  levocetirizine Elita Boone(XYZAL) 5 MG tablet  03/10/15  Yes [provider]  montelukast (SINGULAIR) 10 MG tablet  03/10/15  Yes [provider]  omeprazole (PRILOSEC) 40 MG capsule Reported on 01/05/2016 03/15/15  Yes [provider]  spironolactone (ALDACTONE) 100 MG tablet Take 100 mg by mouth daily.   Yes [provider]  tretinoin (RETIN-A) 0.025 % cream Apply topically at bedtime.   Yes [provider]  Burr MedicoXULANE 150-35 MCG/24HR transdermal patch APPLY 1 PATCH EVERY WEEK AS DIRECTED 10/12/17  Yes [provider]  meloxicam (MOBIC) 15 MG tablet TAKE 1 TABLET BY MOUTH EVERY DAY Patient not taking: Reported on 01/26/2018 11/30/17   Sheliah Hatchabori, Katherine E, MD  Multiple Vitamin (MULTIVITAMIN) tablet Take 1 tablet by mouth daily. Reported on 01/05/2016    [provider]  promethazine-dextromethorphan (PROMETHAZINE-DM) 6.25-15 MG/5ML syrup Take 5 mLs by mouth 4 (four) times daily as needed. Patient not taking: Reported on 01/26/2018 10/28/17   Sheliah Hatchabori, Katherine E, MD    No Known Allergies  Patient Active Problem List   Diagnosis Date Noted  . Hematuria 11/11/2015  . Hand, foot and mouth disease 05/28/2015    . Acute pharyngitis 05/17/2015  . Folliculitis 05/17/2015  . Allergic rhinitis 05/17/2015  . Neck pain, bilateral posterior 04/18/2015  . Routine general medical examination at a health care facility 09/02/2012    Past Medical History:  Diagnosis Date  . Allergy   . Chicken pox   . Environmental allergies   . Scoliosis   . Seizures (HCC) infancy   Noted only occured once with a fever during infancy  . Sinusitis    recurrent    No past surgical history on file.  Social History   Socioeconomic History  . Marital status: Single    Spouse name: Not on file  . Number of children: Not on file  . Years of education: Not on file  . Highest education level: Not on file  Social Needs  . Financial resource strain: Not on file  . Food insecurity - worry: Not on file  . Food insecurity - inability: Not on file  . Transportation needs - medical: Not on file  . Transportation needs - non-medical: Not on file  Occupational History  . Not on file  Tobacco Use  . Smoking status: Never Smoker  . Smokeless tobacco: Never Used  Substance and Sexual Activity  . Alcohol use: No  . Drug use: No  . Sexual activity: Not on file  Other Topics Concern  . Not on file  Social History Narrative  . Not on file    Family History  Problem Relation Age of Onset  . Hypertension Mother   .  Diabetes Mother   . Hypertension Father   . Kidney disease Father   . Diabetes Father   . Hypertension Maternal Grandmother   . Diabetes Maternal Grandmother   . Cancer Maternal Grandfather   . Stroke Maternal Grandfather   . Hypertension Maternal Grandfather   . Diabetes Maternal Grandfather   . Arthritis Paternal Grandmother   . Hypertension Paternal Grandmother   . Diabetes Paternal Grandmother   . Hypertension Paternal Grandfather   . Diabetes Paternal Grandfather      Review of Systems  Constitutional: Negative.  Negative for chills and fever.  Respiratory: Negative.  Negative for  shortness of breath.   Gastrointestinal: Negative.  Negative for abdominal pain, nausea and vomiting.  Musculoskeletal: Negative for back pain and joint pain.  Skin: Negative.  Negative for rash.  Neurological: Negative.  Negative for sensory change and focal weakness.  Endo/Heme/Allergies: Negative.   All other systems reviewed and are negative.   Vitals:   01/26/18 1454  BP: 104/70  Pulse: 86  Resp: 16  Temp: 98 F (36.7 C)  SpO2: 98%    Physical Exam  Constitutional: She is oriented to person, place, and time. She appears well-developed and well-nourished.  HENT:  Head: Normocephalic and atraumatic.  Eyes: Pupils are equal, round, and reactive to light.  Neck: Normal range of motion.  Cardiovascular: Normal rate.  Pulmonary/Chest: Effort normal.  Musculoskeletal: Normal range of motion.  Right foot: No bruising or erythema.  Full range of motion.  NVI.  No significant tenderness or swelling.  Neurological: She is alert and oriented to person, place, and time. No sensory deficit. She exhibits normal muscle tone.  Skin: Skin is warm and dry. Capillary refill takes less than 2 seconds. No rash noted.  Psychiatric: She has a normal mood and affect. Her behavior is normal.  Vitals reviewed.  Dg Foot Complete Right  Result Date: 01/26/2018 CLINICAL DATA:  Right foot pain EXAM: RIGHT FOOT COMPLETE - 3+ VIEW COMPARISON:  None. FINDINGS: There is no evidence of fracture or dislocation. There is no evidence of arthropathy or other focal bone abnormality. Soft tissues are unremarkable. IMPRESSION: Negative. Electronically Signed   By: Marnee Spring M.D.   On: 01/26/2018 15:42   X-ray reviewed with patient in the room.  No acute findings.  Radiologist report reviewed as well.   A total of 25 minutes was spent in the room with the patient, greater than 50% of which was in counseling/coordination of care.  ASSESSMENT & PLAN: Crystal Sims was seen today for foot pain.  Diagnoses and all  orders for this visit:  Right foot pain -     DG Foot Complete Right; Future    Patient Instructions       IF you received an x-ray today, you will receive an invoice from [facility]. Please contact [facility] at [phone] with questions or concerns regarding your invoice.   IF you received labwork today, you will receive an invoice from [facility]. Please contact [facility] at [phone] with questions or concerns regarding your invoice.   Our billing staff will not be able to assist you with questions regarding bills from these companies.  You will be contacted with the lab results as soon as they are available. The fastest way to get your results is to activate your My Chart account. Instructions are located on the last page of this paperwork. If you have not heard from [place] regarding the results in 2 weeks, please contact this office.  Foot Pain Many things can cause foot pain. Some common causes are:  An injury.  A sprain.  Arthritis.  Blisters.  Bunions.  Follow these instructions at home: Pay attention to any changes in your symptoms. Take these actions to help with your discomfort:  If directed, put ice on the affected area: ? Put ice in a plastic bag. ? Place a towel between your skin and the bag. ? Leave the ice on for 15-20 minutes, 3?4 times a day for 2 days.  Take over-the-counter and prescription medicines only as told by your health care provider.  Wear comfortable, supportive shoes that fit you well. Do not wear high heels.  Do not stand or walk for long periods of time.  Do not lift a lot of weight. This can put added pressure on your feet.  Do stretches to relieve foot pain and stiffness as told by your health care provider.  Rub your foot gently.  Keep your feet clean and dry.  Contact a health care provider if:  Your pain does not get better after a few days of self-care.  Your pain gets worse.  You cannot stand  on your foot. Get help right away if:  Your foot is numb or tingling.  Your foot or toes are swollen.  Your foot or toes turn white or blue.  You have warmth and redness along your foot. This information is not intended to replace advice given to you by your health care provider. Make sure you discuss any questions you have with your health care provider. Document Released: 12/14/2015 Document Revised: 04/24/2016 Document Reviewed: 12/13/2014 Elsevier Interactive Patient Education  2018 [employer].      Edwina Barth, MD [facility]

## 2018-04-22 ENCOUNTER — Other Ambulatory Visit: Payer: Self-pay

## 2018-04-22 ENCOUNTER — Ambulatory Visit: Payer: BC Managed Care – PPO | Admitting: Family Medicine

## 2018-04-22 ENCOUNTER — Encounter: Payer: Self-pay | Admitting: Family Medicine

## 2018-04-22 VITALS — BP 112/78 | HR 85 | Temp 97.5°F | Resp 16 | Ht 65.25 in | Wt 185.6 lb

## 2018-04-22 DIAGNOSIS — L02229 Furuncle of trunk, unspecified: Secondary | ICD-10-CM | POA: Diagnosis not present

## 2018-04-22 MED ORDER — CEPHALEXIN 500 MG PO CAPS: 500.0000 mg | ORAL_CAPSULE | Freq: Three times a day (TID) | ORAL | 0 refills | Status: AC

## 2018-04-22 NOTE — Patient Instructions (Signed)
Schedule your complete physical at your convenience START the Keflex 3x/day- w/ food Apply hot compresses to help bring the area to a head and help it drain Call with any questions or concerns Have a great summer!!

## 2018-04-22 NOTE — Progress Notes (Signed)
   Subjective:    Patient ID: Crystal Sims, female    DOB: 1981-08-12, 37 y.o.   MRN: [id number]  HPI Bump- 'I think it's an ingrown hair'.  First noticed on Sunday under L breast.  Area is now sore, starting to itch, and red.  No drainage.  No fevers.  No other similar spots.   Review of Systems For ROS see HPI     Objective:   Physical Exam  Constitutional: She appears well-developed and well-nourished. No distress.  HENT:  Head: Normocephalic and atraumatic.  Skin: Skin is warm and dry. There is erythema (mild erythema surround 1 cm of induration under L breast at bra line, TTP, no abscess to drain at this time).  Vitals reviewed.         Assessment & Plan:  Boil- new.  Reviewed dx and tx w/ pt.  Start Keflex TID.  Hot compresses.  Reviewed supportive care and red flags that should prompt return.  Pt expressed understanding and is in agreement w/ plan.

## 2018-08-19 ENCOUNTER — Other Ambulatory Visit: Payer: Self-pay | Admitting: General Practice

## 2018-08-19 ENCOUNTER — Ambulatory Visit: Payer: BC Managed Care – PPO | Admitting: Family Medicine

## 2018-08-19 ENCOUNTER — Encounter: Payer: Self-pay | Admitting: Family Medicine

## 2018-08-19 ENCOUNTER — Other Ambulatory Visit: Payer: Self-pay

## 2018-08-19 VITALS — BP 120/81 | HR 76 | Temp 98.1°F | Resp 16 | Ht 65.0 in | Wt 184.0 lb

## 2018-08-19 DIAGNOSIS — B9789 Other viral agents as the cause of diseases classified elsewhere: Secondary | ICD-10-CM

## 2018-08-19 DIAGNOSIS — K121 Other forms of stomatitis: Secondary | ICD-10-CM

## 2018-08-19 DIAGNOSIS — K130 Diseases of lips: Secondary | ICD-10-CM | POA: Diagnosis not present

## 2018-08-19 MED ORDER — MELOXICAM 15 MG PO TABS: 15.0000 mg | ORAL_TABLET | Freq: Every day | ORAL | 0 refills | Status: DC

## 2018-08-19 NOTE — Patient Instructions (Signed)
Follow up as needed or as scheduled This appears to be a virus and should improve w/ time Drink plenty of fluids Ibuprofen as needed for discomfort Get OTC Clotrimazole (found in the athlete's foot section) and apply small layer to corner's of mouth bilaterally twice daily Call with any questions or concerns- particularly if a rash develops Hang in there!!!

## 2018-08-19 NOTE — Progress Notes (Signed)
   Subjective:    Patient ID: Crystal Sims, female    DOB: February 21, 1981, 37 y.o.   MRN: [id number]  HPI Tongue spots- pt initially thought she had a sore throat (2 weeks ago).  Sore throat is gone.  But now tongue is white and she had red spots posteriorly.  Now having pain in the corners of mouth.  No fevers.  Not feeling ill, just noticed changes.  No rashes.  No recent sick contacts.   Review of Systems For ROS see HPI     Objective:   Physical Exam  Constitutional: She is oriented to person, place, and time. She appears well-developed and well-nourished. No distress.  HENT:  Head: Normocephalic and atraumatic.  Nose: Nose normal.  Mouth/Throat: No oropharyngeal exudate.  Erythematous macules w/ pale centers on hard palate consistent w/ viral stomatitis.  No palatal petechiae Tongue WNL Tonsils WNL  Lymphadenopathy:    She has no cervical adenopathy.  Neurological: She is alert and oriented to person, place, and time.  Skin: Skin is warm and dry.  Psychiatric: She has a normal mood and affect. Her behavior is normal. Thought content normal.  Vitals reviewed.         Assessment & Plan:  Angular cheilitis- new.  Start topical antifungal  Stomatitis- new. No obvious bacterial infxn.  Most likely viral.  As pt is asymptomatic will just proceed w/ supportive care.  Pt expressed understanding and is in agreement w/ plan.

## 2018-09-21 ENCOUNTER — Ambulatory Visit: Payer: BC Managed Care – PPO | Admitting: Family Medicine

## 2018-09-21 ENCOUNTER — Encounter: Payer: Self-pay | Admitting: Family Medicine

## 2018-09-21 ENCOUNTER — Other Ambulatory Visit: Payer: Self-pay

## 2018-09-21 VITALS — BP 118/84 | HR 100 | Temp 98.1°F | Ht 65.0 in | Wt 182.2 lb

## 2018-09-21 DIAGNOSIS — J301 Allergic rhinitis due to pollen: Secondary | ICD-10-CM

## 2018-09-21 DIAGNOSIS — B9689 Other specified bacterial agents as the cause of diseases classified elsewhere: Secondary | ICD-10-CM

## 2018-09-21 DIAGNOSIS — Z23 Encounter for immunization: Secondary | ICD-10-CM | POA: Diagnosis not present

## 2018-09-21 DIAGNOSIS — J208 Acute bronchitis due to other specified organisms: Secondary | ICD-10-CM

## 2018-09-21 MED ORDER — AZITHROMYCIN 250 MG PO TABS: ORAL_TABLET | ORAL | 0 refills | Status: DC

## 2018-09-21 MED ORDER — BENZONATATE 100 MG PO CAPS: 100.0000 mg | ORAL_CAPSULE | Freq: Two times a day (BID) | ORAL | 0 refills | Status: DC | PRN

## 2018-09-21 NOTE — Progress Notes (Signed)
Subjective  CC:  Chief Complaint  Patient presents with  . Sinus Problem    sinus congestion and productive cough x 8 days     HPI: SUBJECTIVE:  Crystal Sims is a 37 y.o. female who complains of congestion, nasal blockage, post nasal drip, cough described as productive and constant and denies high fevers, SOB, chest pain or significant GI symptoms. Symptoms have been present for 9 days. sees Dr. [place] Callas for severe allergies. PND and productive cough is yellow in color and thick. . She denies a history of anorexia, dizziness, vomiting and wheezing. She denies a history of asthma or COPD. Patient does not smoke cigarettes. She is on daily inhaler.   Assessment  1. Seasonal allergic rhinitis due to pollen   2. Acute bacterial bronchitis      Plan  Discussion:  Treat for bacterial bronchitis due to prolonged course and worsening symptoms. Education regarding differences between viral and bacterial infections and treatment options are discussed.  Supportive care measures are recommended.  We discussed the use of mucolytic's, decongestants, antihistamines and antitussives as needed.  Tylenol or Advil are recommended if needed.  Follow up: Return if symptoms worsen or fail to improve.   No orders of the defined types were placed in this encounter.  Meds ordered this encounter  Medications  . azithromycin (ZITHROMAX) 250 MG tablet    Sig: Take 2 tabs today, then 1 tab daily for 4 days    Dispense:  1 each    Refill:  0  . benzonatate (TESSALON) 100 MG capsule    Sig: Take 1 capsule (100 mg total) by mouth 2 (two) times daily as needed for cough.    Dispense:  20 capsule    Refill:  0      I reviewed the patients updated PMH, FH, and SocHx.  Social History: Patient  reports that she has never smoked. She has never used smokeless tobacco. She reports that she does not drink alcohol or use drugs.  Patient Active Problem List   Diagnosis Date Noted  . Right foot pain 01/26/2018  .  Hematuria 11/11/2015  . Hand, foot and mouth disease 05/28/2015  . Acute pharyngitis 05/17/2015  . Folliculitis 05/17/2015  . Allergic rhinitis 05/17/2015  . Neck pain, bilateral posterior 04/18/2015  . Routine general medical examination at a health care facility 09/02/2012    Review of Systems: Cardiovascular: negative for chest pain Respiratory: negative for SOB or hemoptysis Gastrointestinal: negative for abdominal pain Genitourinary: negative for dysuria or gross hematuria Current Meds  Medication Sig  . BREO ELLIPTA 100-25 MCG/INH AEPB 1 PUFF ONCE A DAY INHALATION 30 DAYS  . fluticasone (FLONASE) 50 MCG/ACT nasal spray Place 2 sprays into both nostrils daily.  Marland Kitchen ipratropium (ATROVENT) 0.03 % nasal spray Place 2 sprays into both nostrils 2 (two) times daily.  Marland Kitchen levocetirizine (XYZAL) 5 MG tablet   . meloxicam (MOBIC) 15 MG tablet Take 1 tablet (15 mg total) by mouth daily.  . montelukast (SINGULAIR) 10 MG tablet   . Multiple Vitamin (MULTIVITAMIN) tablet Take 1 tablet by mouth daily. Reported on 01/05/2016  . spironolactone (ALDACTONE) 100 MG tablet Take 100 mg by mouth daily.  Burr Medico 150-35 MCG/24HR transdermal patch APPLY 1 PATCH EVERY WEEK AS DIRECTED    Objective  Vitals: BP 118/84   Pulse 100   Temp 98.1 F (36.7 C)   Ht 5\' 5"  (1.651 m)   Wt 182 lb 3.2 oz (82.6 kg)   SpO2 99%  BMI 30.32 kg/m  General: no acute distress  Psych:  Alert and oriented, normal mood and affect, coughing HEENT:  Normocephalic, atraumatic, supple neck, moist mucous membranes, mildly erythematous pharynx without exudate, mild lymphadenopathy, supple neck, nasal congestion present Cardiovascular:  RRR without murmur. no edema Respiratory:  Good breath sounds bilaterally, CTAB with normal respiratory effort without wheezing or rhonchi Skin:  Warm, no rashes Neurologic:   Mental status is normal. normal gait  Commons side effects, risks, benefits, and alternatives for medications and  treatment plan prescribed today were discussed, and the patient expressed understanding of the given instructions. Patient is instructed to call or message via MyChart if he/she has any questions or concerns regarding our treatment plan. No barriers to understanding were identified. We discussed Red Flag symptoms and signs in detail. Patient expressed understanding regarding what to do in case of urgent or emergency type symptoms.  Medication list was reconciled, printed and provided to the patient in AVS. Patient instructions and summary information was reviewed with the patient as documented in the AVS. This note was prepared with assistance of Dragon voice recognition software. Occasional wrong-word or sound-a-like substitutions may have occurred due to the inherent limitations of voice recognition software

## 2018-09-21 NOTE — Patient Instructions (Signed)
Please follow up if symptoms do not improve or as needed.   You may use Delsym cough syrup or Mucinex DM to help with congestion and coughing.   Acute Bronchitis, Adult Acute bronchitis is sudden (acute) swelling of the air tubes (bronchi) in the lungs. Acute bronchitis causes these tubes to fill with mucus, which can make it hard to breathe. It can also cause coughing or wheezing. In adults, acute bronchitis usually goes away within 2 weeks. A cough caused by bronchitis may last up to 3 weeks. Smoking, allergies, and asthma can make the condition worse. Repeated episodes of bronchitis may cause further lung problems, such as chronic obstructive pulmonary disease (COPD). What are the causes? This condition can be caused by germs and by substances that irritate the lungs, including:  Cold and flu viruses. This condition is most often caused by the same virus that causes a cold.  Bacteria.  Exposure to tobacco smoke, dust, fumes, and air pollution.  What increases the risk? This condition is more likely to develop in people who:  Have close contact with someone with acute bronchitis.  Are exposed to lung irritants, such as tobacco smoke, dust, fumes, and vapors.  Have a weak immune system.  Have a respiratory condition such as asthma.  What are the signs or symptoms? Symptoms of this condition include:  A cough.  Coughing up clear, yellow, or green mucus.  Wheezing.  Chest congestion.  Shortness of breath.  A fever.  Body aches.  Chills.  A sore throat.  How is this diagnosed? This condition is usually diagnosed with a physical exam. During the exam, your health care provider may order tests, such as chest X-rays, to rule out other conditions. He or she may also:  Test a sample of your mucus for bacterial infection.  Check the level of oxygen in your blood. This is done to check for pneumonia.  Do a chest X-ray or lung function testing to rule out pneumonia and  other conditions.  Perform blood tests.  Your health care provider will also ask about your symptoms and medical history. How is this treated? Most cases of acute bronchitis clear up over time without treatment. Your health care provider may recommend:  Drinking more fluids. Drinking more makes your mucus thinner, which may make it easier to breathe.  Taking a medicine for a fever or cough.  Taking an antibiotic medicine.  Using an inhaler to help improve shortness of breath and to control a cough.  Using a cool mist vaporizer or humidifier to make it easier to breathe.  Follow these instructions at home: Medicines  Take over-the-counter and prescription medicines only as told by your health care provider.  If you were prescribed an antibiotic, take it as told by your health care provider. Do not stop taking the antibiotic even if you start to feel better. General instructions  Get plenty of rest.  Drink enough fluids to keep your urine clear or pale yellow.  Avoid smoking and secondhand smoke. Exposure to cigarette smoke or irritating chemicals will make bronchitis worse. If you smoke and you need help quitting, ask your health care provider. Quitting smoking will help your lungs heal faster.  Use an inhaler, cool mist vaporizer, or humidifier as told by your health care provider.  Keep all follow-up visits as told by your health care provider. This is important. How is this prevented? To lower your risk of getting this condition again:  Wash your hands often with soap   and water. If soap and water are not available, use hand sanitizer.  Avoid contact with people who have cold symptoms.  Try not to touch your hands to your mouth, nose, or eyes.  Make sure to get the flu shot every year.  Contact a health care provider if:  Your symptoms do not improve in 2 weeks of treatment. Get help right away if:  You cough up blood.  You have chest pain.  You have severe  shortness of breath.  You become dehydrated.  You faint or keep feeling like you are going to faint.  You keep vomiting.  You have a severe headache.  Your fever or chills gets worse. This information is not intended to replace advice given to you by your health care provider. Make sure you discuss any questions you have with your health care provider. Document Released: 12/25/2004 Document Revised: 06/11/2016 Document Reviewed: 05/07/2016 Elsevier Interactive Patient Education  2018 [employer] Inc[employer]   

## 2018-12-29 DIAGNOSIS — Z803 Family history of malignant neoplasm of breast: Secondary | ICD-10-CM | POA: Insufficient documentation

## 2018-12-31 ENCOUNTER — Ambulatory Visit: Payer: BC Managed Care – PPO | Admitting: Family Medicine

## 2018-12-31 ENCOUNTER — Encounter: Payer: Self-pay | Admitting: Family Medicine

## 2018-12-31 ENCOUNTER — Other Ambulatory Visit: Payer: Self-pay

## 2018-12-31 VITALS — BP 106/74 | HR 98 | Temp 98.6°F | Resp 16 | Ht 65.0 in | Wt 182.0 lb

## 2018-12-31 DIAGNOSIS — J019 Acute sinusitis, unspecified: Secondary | ICD-10-CM

## 2018-12-31 DIAGNOSIS — R0982 Postnasal drip: Secondary | ICD-10-CM

## 2018-12-31 MED ORDER — PREDNISONE 10 MG PO TABS: ORAL_TABLET | ORAL | 0 refills | Status: DC

## 2018-12-31 NOTE — Progress Notes (Signed)
   Subjective:    Patient ID: Crystal Sims, female    DOB: 19-Jun-1981, 38 y.o.   MRN: [id number]  HPI URI- sxs started ~ 3 days w/ PND, then sore throat, now laryngitis and chest congestion.  Minimal cough.  Intermittent HA- frontal, 'feels heavy', worse w/ leaning forward.  No ear pain.  No fevers.  Taking all allergy medications.   Review of Systems For ROS see HPI     Objective:   Physical Exam Vitals signs reviewed.  Constitutional:      General: She is not in acute distress.    Appearance: Normal appearance. She is well-developed.  HENT:     Head: Normocephalic and atraumatic.     Right Ear: Tympanic membrane normal.     Left Ear: Tympanic membrane normal.     Nose: Mucosal edema and rhinorrhea present.     Right Sinus: No maxillary sinus tenderness or frontal sinus tenderness.     Left Sinus: No maxillary sinus tenderness or frontal sinus tenderness.     Mouth/Throat:     Pharynx: Posterior oropharyngeal erythema (w/ PND) present.  Eyes:     Conjunctiva/sclera: Conjunctivae normal.     Pupils: Pupils are equal, round, and reactive to light.  Neck:     Musculoskeletal: Normal range of motion and neck supple.  Cardiovascular:     Rate and Rhythm: Normal rate and regular rhythm.     Heart sounds: Normal heart sounds.  Pulmonary:     Effort: Pulmonary effort is normal. No respiratory distress.     Breath sounds: Normal breath sounds. No wheezing or rales.  Lymphadenopathy:     Cervical: No cervical adenopathy.  Neurological:     Mental Status: She is alert.           Assessment & Plan:  PND w/ sinusitis- no evidence of bacterial infection but inflammation present.  Start prednisone taper.  Reviewed supportive care and red flags that should prompt return.  Pt expressed understanding and is in agreement w/ plan.

## 2018-12-31 NOTE — Patient Instructions (Signed)
Follow up as needed or as scheduled CONTINUE your allergy medication as directed START the Prednisone as directed- take w/ food Drink plenty of fluids REST your voice! Call with any questions or concerns Hang in there!!!

## 2019-01-05 ENCOUNTER — Telehealth: Payer: Self-pay | Admitting: *Deleted

## 2019-01-05 NOTE — Telephone Encounter (Signed)
The steroids are improving the inflammation and causing things to break up (the thick yellow phlegm)- this is good news.  Mucinex DM or Delsym will help w/ cough.  No appt needed at this time

## 2019-01-05 NOTE — Telephone Encounter (Signed)
Copied from CRM [id number]. Topic: General - Other >> Jan 05, 2019  8:09 AM Elliot Gault wrote: Relation to pt: self  Call back number:[phone] Pharmacy: [id number] [place], [place] - [address] [phone] (Phone) [phone] (Fax)    Reason for call:  Patient was last seen 12/31/2018 for post nasal drip symptoms have improved slightly, new symptoms coughing up thick yellow phlegm.  3 days left on the predniSONE (DELTASONE) 10 MG tablet. Patient unsure if she should follow up with appointment or if additional Rx is needed, please advise patient directly

## 2019-01-05 NOTE — Telephone Encounter (Signed)
Called and advised pt of PCP recommendations. She stated an understanding and is agreeable to get OTC meds.

## 2019-01-05 NOTE — Telephone Encounter (Signed)
Please advise 

## 2019-01-14 ENCOUNTER — Ambulatory Visit: Payer: BC Managed Care – PPO | Admitting: Family Medicine

## 2019-01-14 ENCOUNTER — Encounter: Payer: Self-pay | Admitting: Family Medicine

## 2019-01-14 ENCOUNTER — Other Ambulatory Visit: Payer: Self-pay

## 2019-01-14 VITALS — BP 124/78 | HR 105 | Temp 98.2°F | Resp 16 | Ht 65.0 in | Wt 184.0 lb

## 2019-01-14 DIAGNOSIS — J309 Allergic rhinitis, unspecified: Secondary | ICD-10-CM

## 2019-01-14 MED ORDER — MOMETASONE FUROATE 50 MCG/ACT NA SUSP: 2.0000 | Freq: Every day | NASAL | 3 refills | Status: DC

## 2019-01-14 MED ORDER — CROMOLYN SODIUM 5.2 MG/ACT NA AERS: 1.0000 | INHALATION_SPRAY | Freq: Three times a day (TID) | NASAL | 3 refills | Status: DC

## 2019-01-14 NOTE — Patient Instructions (Signed)
Follow up as needed or as scheduled Call Dr Lyla Son office and schedule an appt STOP the Flonase (Fluticasone) SWITCH to Nasonex (Mometasone)- 2 sprays each nostril daily ADD Cromolyn nasal spray- 1 spray each nostril 3x/day Drink plenty of fluids Robitussin or Delsym as needed for cough Call with any questions or concerns Hang in there!!

## 2019-01-14 NOTE — Progress Notes (Signed)
   Subjective:    Patient ID: Crystal Sims, female    DOB: 08-24-1981, 38 y.o.   MRN: [id number]  HPI URI- pt reports chest soreness and tightness starting this week despite using Mucinex.  No fevers.  Cough is dry.  Pt was seen for similar on 1/31 and was started on Prednisone for post-nasal drip.  This improved things temporarily but sxs returned.  Yesterday again had sore throat.  L maxillary sinus pressure w/ associated tooth pain.  No ear pain.  Sees Dr Wasilla Callas for allergy.  Is on Flonase, Atrovent, Astelin, Singulair and daily antihistamine.   Review of Systems For ROS see HPI     Objective:   Physical Exam Vitals signs reviewed.  Constitutional:      General: She is not in acute distress.    Appearance: She is well-developed.  HENT:     Head: Normocephalic and atraumatic.     Right Ear: Tympanic membrane normal.     Left Ear: Tympanic membrane normal.     Nose: Mucosal edema and rhinorrhea present.     Right Sinus: No maxillary sinus tenderness or frontal sinus tenderness.     Left Sinus: No maxillary sinus tenderness or frontal sinus tenderness.     Mouth/Throat:     Pharynx: Posterior oropharyngeal erythema (w/ PND) present.  Eyes:     Conjunctiva/sclera: Conjunctivae normal.     Pupils: Pupils are equal, round, and reactive to light.  Neck:     Musculoskeletal: Normal range of motion and neck supple.  Cardiovascular:     Rate and Rhythm: Normal rate and regular rhythm.     Heart sounds: Normal heart sounds.  Pulmonary:     Effort: Pulmonary effort is normal. No respiratory distress.     Breath sounds: Normal breath sounds. No wheezing or rales.  Lymphadenopathy:     Cervical: No cervical adenopathy.           Assessment & Plan:

## 2019-01-17 NOTE — Assessment & Plan Note (Signed)
Deteriorated.  Pt continues to have copious PND despite Flonase, Atrovent, and Astelin nasal sprays.  Also on Singulair and daily antihistamine.  She see Dr  Callas for allergy but has not been recently.  Encouraged her to schedule an appt as he has been suggesting allergy shots.  Will switch to Nasonex from Flonase and add Cromolyn to see if sxs improve.  No evidence of bacterial infxn on PE so no need for abx.  Will follow.

## 2019-02-03 ENCOUNTER — Ambulatory Visit (INDEPENDENT_AMBULATORY_CARE_PROVIDER_SITE_OTHER): Payer: BC Managed Care – PPO | Admitting: Otolaryngology

## 2019-02-03 DIAGNOSIS — J343 Hypertrophy of nasal turbinates: Secondary | ICD-10-CM

## 2019-02-03 DIAGNOSIS — J330 Polyp of nasal cavity: Secondary | ICD-10-CM

## 2019-02-03 DIAGNOSIS — J310 Chronic rhinitis: Secondary | ICD-10-CM | POA: Diagnosis not present

## 2019-02-25 ENCOUNTER — Encounter: Payer: BC Managed Care – PPO | Admitting: Family Medicine

## 2019-04-08 ENCOUNTER — Other Ambulatory Visit: Payer: Self-pay | Admitting: Family Medicine

## 2019-04-11 ENCOUNTER — Encounter: Payer: BC Managed Care – PPO | Admitting: Family Medicine

## 2019-04-19 ENCOUNTER — Other Ambulatory Visit: Payer: Self-pay | Admitting: Family Medicine

## 2019-04-19 NOTE — Addendum Note (Signed)
Addended by: Geannie Risen on: 04/19/2019 12:19 PM   Modules accepted: Orders

## 2019-04-19 NOTE — Telephone Encounter (Signed)
Pt asking for a refill on meloxicam for her neck & back pain that she has ongoing issues with. [facility] [address] Pt has an appt schedule for tomorrow to discuss an issue.

## 2019-04-19 NOTE — Telephone Encounter (Signed)
Medication was pended, please advise if ok to fill?

## 2019-04-20 ENCOUNTER — Ambulatory Visit (INDEPENDENT_AMBULATORY_CARE_PROVIDER_SITE_OTHER): Payer: BC Managed Care – PPO | Admitting: Family Medicine

## 2019-04-20 ENCOUNTER — Encounter: Payer: Self-pay | Admitting: Family Medicine

## 2019-04-20 ENCOUNTER — Other Ambulatory Visit: Payer: Self-pay

## 2019-04-20 VITALS — Temp 98.1°F | Wt 184.0 lb

## 2019-04-20 DIAGNOSIS — L089 Local infection of the skin and subcutaneous tissue, unspecified: Secondary | ICD-10-CM

## 2019-04-20 DIAGNOSIS — L723 Sebaceous cyst: Secondary | ICD-10-CM | POA: Diagnosis not present

## 2019-04-20 MED ORDER — DOXYCYCLINE HYCLATE 100 MG PO TABS: 100.0000 mg | ORAL_TABLET | Freq: Two times a day (BID) | ORAL | 0 refills | Status: DC

## 2019-04-20 MED ORDER — MELOXICAM 15 MG PO TABS: 15.0000 mg | ORAL_TABLET | Freq: Every day | ORAL | 0 refills | Status: DC

## 2019-04-20 NOTE — Progress Notes (Signed)
I have discussed the procedure for the virtual visit with the patient who has given consent to proceed with assessment and treatment.   Jessica L Brodmerkel, CMA     

## 2019-04-20 NOTE — Progress Notes (Signed)
Virtual Visit via Video   I connected with patient on 04/20/19 at  2:00 PM EDT by a video enabled telemedicine application and verified that I am speaking with the correct person using two identifiers.  Location patient: Home Location provider: [facility], Office Persons participating in the virtual visit: Patient, Provider, CMA (Jess B)  I discussed the limitations of evaluation and management by telemedicine and the availability of in person appointments. The patient expressed understanding and agreed to proceed.  Subjective:   HPI:   Ingrown hair- pt reports 'it's really large and painful' on back of neck.  First appeared last week, worsened over the weekend.  Notices smaller area above the larger bump.  Had ingrown hair under R arm ~2 weeks ago.  Used warm compresses, some drainage.  Area is much improved.  Not painful.  ROS:   See pertinent positives and negatives per HPI.  Patient Active Problem List   Diagnosis Date Noted  . Right foot pain 01/26/2018  . Hematuria 11/11/2015  . Folliculitis 05/17/2015  . Allergic rhinitis 05/17/2015  . Neck pain, bilateral posterior 04/18/2015  . Routine general medical examination at a health care facility 09/02/2012    Social History   Tobacco Use  . Smoking status: Never Smoker  . Smokeless tobacco: Never Used  Substance Use Topics  . Alcohol use: No    Current Outpatient Medications:  .  azelastine (ASTELIN) 0.1 % nasal spray, Place into both nostrils 2 (two) times daily. Use in each nostril as directed, Disp: , Rfl:  .  BREO ELLIPTA 100-25 MCG/INH AEPB, 1 PUFF ONCE A DAY INHALATION 30 DAYS, Disp: , Rfl: 6 .  cromolyn (NASALCROM) 5.2 MG/ACT nasal spray, Place 1 spray into both nostrils 3 (three) times daily., Disp: 26 mL, Rfl: 3 .  ipratropium (ATROVENT) 0.03 % nasal spray, Place 2 sprays into both nostrils 2 (two) times daily., Disp: 30 mL, Rfl: 0 .  IRON PO, Take 1 tablet by mouth daily., Disp: , Rfl:  .   levocetirizine (XYZAL) 5 MG tablet, , Disp: , Rfl: 3 .  meloxicam (MOBIC) 15 MG tablet, Take 1 tablet (15 mg total) by mouth daily., Disp: 30 tablet, Rfl: 0 .  mometasone (NASONEX) 50 MCG/ACT nasal spray, PLACE 2 SPRAYS INTO THE NOSE DAILY., Disp: 17 g, Rfl: 1 .  montelukast (SINGULAIR) 10 MG tablet, , Disp: , Rfl: 3 .  Multiple Vitamin (MULTIVITAMIN) tablet, Take 1 tablet by mouth daily. Reported on 01/05/2016, Disp: , Rfl:  .  omeprazole (PRILOSEC) 40 MG capsule, Reported on 01/05/2016, Disp: , Rfl: 3 .  spironolactone (ALDACTONE) 100 MG tablet, Take 100 mg by mouth daily., Disp: , Rfl:  .  tretinoin (RETIN-A) 0.025 % cream, Apply topically at bedtime., Disp: , Rfl:  .  XULANE 150-35 MCG/24HR transdermal patch, APPLY 1 PATCH EVERY WEEK AS DIRECTED, Disp: , Rfl: 2  No Known Allergies  Objective:   Temp 98.1 F (36.7 C) (Oral)   Wt 184 lb (83.5 kg)   BMI 30.62 kg/m  AAOx3, NAD NCAT, EOMI No obvious CN deficits Coloring WNL Large erythematous bump on back of neck w/ central pore Pt is able to speak clearly, coherently without shortness of breath or increased work of breathing.  Thought process is linear.  Mood is appropriate.   Assessment and Plan:   Infected sebaceous cyst- new.  Pt's sxs and PE consistent w/ infxn.  Start Doxy.  Hot compresses.  If no improvement by next week, pt will  need in office appt.  Pt expressed understanding and is in agreement w/ plan.    Neena Rhymes, MD 04/20/2019

## 2019-04-21 ENCOUNTER — Other Ambulatory Visit: Payer: Self-pay | Admitting: Family Medicine

## 2019-05-06 ENCOUNTER — Telehealth: Payer: Self-pay | Admitting: Family Medicine

## 2019-05-06 NOTE — Telephone Encounter (Signed)
Called and pt scheduled in office for 30 minutes on Tuesday.

## 2019-05-06 NOTE — Telephone Encounter (Deleted)
The bumps on the arm and neck are not completely gone.

## 2019-05-06 NOTE — Telephone Encounter (Signed)
Please advise 

## 2019-05-06 NOTE — Telephone Encounter (Addendum)
Pt called in stating that the bump on her neck and arm are not completely gone. The bump is still on her neck and under her arm. It has gotten smaller in sz. It doesn't appear to have any fluid in it. She wanted to know if she should get another antibiotic or come in to have it evaluated.

## 2019-05-06 NOTE — Telephone Encounter (Signed)
Pt needs an in-office visit next week to assess

## 2019-05-10 ENCOUNTER — Encounter: Payer: Self-pay | Admitting: General Practice

## 2019-05-10 ENCOUNTER — Ambulatory Visit: Payer: BC Managed Care – PPO | Admitting: Family Medicine

## 2019-05-10 ENCOUNTER — Other Ambulatory Visit: Payer: Self-pay

## 2019-05-10 ENCOUNTER — Encounter: Payer: Self-pay | Admitting: Family Medicine

## 2019-05-10 VITALS — BP 110/80 | HR 80 | Temp 97.9°F | Resp 15 | Wt 187.4 lb

## 2019-05-10 DIAGNOSIS — L0212 Furuncle of neck: Secondary | ICD-10-CM | POA: Diagnosis not present

## 2019-05-10 DIAGNOSIS — L02421 Furuncle of right axilla: Secondary | ICD-10-CM | POA: Diagnosis not present

## 2019-05-10 NOTE — Patient Instructions (Signed)
Follow up as needed or as scheduled Apply warm compresses to the areas if they remain swollen or tender Some drainage from the neck is to be expected but it was such a small hole it will be minimal If it again fills or becomes infected, let me know and we can do antibiotics Let me know the name of your GYN so we can get those labs Call with any questions or concerns Stay Safe!!!

## 2019-05-10 NOTE — Progress Notes (Signed)
   Subjective:    Patient ID: Crystal Sims, female    DOB: 11/29/1981, 38 y.o.   MRN: [id number]  HPI Boils- midline posterior neck, under R arm.  Pt was seen virtually on 5/20 and started on Doxy.  Areas have improved dramatically but neck is still indurated, TTP.  She reports that she was able to get 'a lot of stuff' out by squeezing it the other night.  Review of Systems For ROS see HPI     Objective:   Physical Exam Vitals signs reviewed.  Constitutional:      General: She is not in acute distress.    Appearance: Normal appearance.  Skin:    General: Skin is warm and dry.     Comments: Firm but freely mobile nontender area in R axilla w/o fluctuance or area to drain Firm, mildly fluctuant area on posterior neck.  Pt consented to I&D.  Area prepped with alcohol, stuck w/ 18 gauge needle, and copious material expressed w/ steady pressure.  Pt tolerated procedure w/o difficulty.  Neurological:     Mental Status: She is alert.           Assessment & Plan:  Neck Boil- improved but neck would benefit from stab I&D.  Pt consented to this and tolerated procedure w/o difficulty.  She noted immediate relief of pain.  Reviewed supportive care and red flags that should prompt return.  Pt expressed understanding and is in agreement w/ plan.   Axilla Boil- nearly completely resolved w/ Doxy.  No need for I&D.

## 2019-05-23 ENCOUNTER — Telehealth: Payer: Self-pay | Admitting: Family Medicine

## 2019-05-23 NOTE — Telephone Encounter (Signed)
Noted, request sent to obtain lab results.

## 2019-05-23 NOTE — Telephone Encounter (Signed)
Pt called in stating that she had her labs done at [facility] their office number is [phone]. She states that Dr. Birdie Riddle told her to get this information to her at her last office visit.

## 2019-06-06 ENCOUNTER — Encounter: Payer: BC Managed Care – PPO | Admitting: Family Medicine

## 2019-07-13 ENCOUNTER — Encounter: Payer: Self-pay | Admitting: Family Medicine

## 2019-07-13 ENCOUNTER — Other Ambulatory Visit: Payer: Self-pay

## 2019-07-13 ENCOUNTER — Ambulatory Visit (INDEPENDENT_AMBULATORY_CARE_PROVIDER_SITE_OTHER): Payer: BC Managed Care – PPO | Admitting: Family Medicine

## 2019-07-13 VITALS — BP 121/83 | HR 65 | Temp 98.0°F | Resp 16 | Ht 65.0 in | Wt 179.0 lb

## 2019-07-13 DIAGNOSIS — L723 Sebaceous cyst: Secondary | ICD-10-CM | POA: Diagnosis not present

## 2019-07-13 DIAGNOSIS — M4126 Other idiopathic scoliosis, lumbar region: Secondary | ICD-10-CM | POA: Diagnosis not present

## 2019-07-13 DIAGNOSIS — Z0000 Encounter for general adult medical examination without abnormal findings: Secondary | ICD-10-CM | POA: Diagnosis not present

## 2019-07-13 DIAGNOSIS — E663 Overweight: Secondary | ICD-10-CM | POA: Diagnosis not present

## 2019-07-13 DIAGNOSIS — E669 Obesity, unspecified: Secondary | ICD-10-CM | POA: Insufficient documentation

## 2019-07-13 LAB — CBC WITH DIFFERENTIAL/PLATELET
Basophils Absolute: 0 10*3/uL (ref 0.0–0.1)
Basophils Relative: 0.6 % (ref 0.0–3.0)
Eosinophils Absolute: 0 10*3/uL (ref 0.0–0.7)
Eosinophils Relative: 0.6 % (ref 0.0–5.0)
HCT: 37.3 % (ref 36.0–46.0)
Hemoglobin: 12.3 g/dL (ref 12.0–15.0)
Lymphocytes Relative: 36.4 % (ref 12.0–46.0)
Lymphs Abs: 1.5 10*3/uL (ref 0.7–4.0)
MCHC: 33 g/dL (ref 30.0–36.0)
MCV: 91 fl (ref 78.0–100.0)
Monocytes Absolute: 0.3 10*3/uL (ref 0.1–1.0)
Monocytes Relative: 7.6 % (ref 3.0–12.0)
Neutro Abs: 2.2 10*3/uL (ref 1.4–7.7)
Neutrophils Relative %: 54.8 % (ref 43.0–77.0)
Platelets: 250 10*3/uL (ref 150.0–400.0)
RBC: 4.1 Mil/uL (ref 3.87–5.11)
RDW: 14.8 % (ref 11.5–15.5)
WBC: 4.1 10*3/uL (ref 4.0–10.5)

## 2019-07-13 LAB — BASIC METABOLIC PANEL
BUN: 7 mg/dL (ref 6–23)
CO2: 26 mEq/L (ref 19–32)
Calcium: 9.6 mg/dL (ref 8.4–10.5)
Chloride: 102 mEq/L (ref 96–112)
Creatinine, Ser: 0.6 mg/dL (ref 0.40–1.20)
GFR: 134.93 mL/min (ref >60.00)
Glucose, Bld: 82 mg/dL (ref 70–99)
Potassium: 4.3 mEq/L (ref 3.5–5.1)
Sodium: 138 mEq/L (ref 135–145)

## 2019-07-13 LAB — HEPATIC FUNCTION PANEL
ALT: 25 U/L (ref 0–35)
AST: 24 U/L (ref 0–37)
Albumin: 4.6 g/dL (ref 3.5–5.2)
Alkaline Phosphatase: 55 U/L (ref 39–117)
Bilirubin, Direct: 0.1 mg/dL (ref 0.0–0.3)
Total Bilirubin: 0.3 mg/dL (ref 0.2–1.2)
Total Protein: 7.3 g/dL (ref 6.0–8.3)

## 2019-07-13 LAB — LIPID PANEL
Cholesterol: 159 mg/dL (ref 0–200)
HDL: 54.2 mg/dL (ref >39.00)
LDL Cholesterol: 81 mg/dL (ref 0–99)
NonHDL: 105.27
Total CHOL/HDL Ratio: 3
Triglycerides: 122 mg/dL (ref 0.0–149.0)
VLDL: 24.4 mg/dL (ref 0.0–40.0)

## 2019-07-13 LAB — TSH: TSH: 1 u[IU]/mL (ref 0.35–4.50)

## 2019-07-13 LAB — VITAMIN D 25 HYDROXY (VIT D DEFICIENCY, FRACTURES): VITD: 22.33 ng/mL — ABNORMAL LOW (ref 30.00–100.00)

## 2019-07-13 NOTE — Assessment & Plan Note (Signed)
Pt is down 8 lbs since last visit.  Applauded her efforts.  Check labs to risk stratify.  Will follow. 

## 2019-07-13 NOTE — Assessment & Plan Note (Signed)
Pt's PE WNL w/ exception of known scoliosis and recurrent sebaceous cyst on neck.  Check labs.  Anticipatory guidance provided.

## 2019-07-13 NOTE — Progress Notes (Signed)
   Subjective:    Patient ID: Crystal Sims, female    DOB: [birth year], 38 y.o.   MRN: [id number]  HPI CPE- pt is down 8 lbs since last visit.  UTD on GYN.  Unsure of last Tdap date.   Review of Systems Patient reports no vision/ hearing changes, adenopathy,fever, weight change,  persistant/recurrent hoarseness , swallowing issues, chest pain, palpitations, edema, persistant/recurrent cough, hemoptysis, dyspnea (rest/exertional/paroxysmal nocturnal), gastrointestinal bleeding (melena, rectal bleeding), abdominal pain, significant heartburn, bowel changes, GU symptoms (dysuria, hematuria, incontinence), Gyn symptoms (abnormal  bleeding, pain),  syncope, focal weakness, memory loss, numbness & tingling, hair/nail changes, abnormal bruising or bleeding, anxiety, or depression.   + LBP due to scoliosis    Objective:   Physical Exam General Appearance:    Alert, cooperative, no distress, appears stated age  Head:    Normocephalic, without obvious abnormality, atraumatic  Eyes:    PERRL, conjunctiva/corneas clear, EOM's intact, fundi    benign, both eyes  Ears:    Normal TM's and external ear canals, both ears  Nose:   Deferred due to COVID  Throat:   Neck:   Supple, symmetrical, trachea midline, no adenopathy;    Thyroid: no enlargement/tenderness/nodules  Back:     Lumbar scoliosis curve  Lungs:     Clear to auscultation bilaterally, respirations unlabored  Chest Wall:    No tenderness or deformity   Heart:    Regular rate and rhythm, S1 and S2 normal, no murmur, rub   or gallop  Breast Exam:    Deferred to GYN  Abdomen:     Soft, non-tender, bowel sounds active all four quadrants,    no masses, no organomegaly  Genitalia:    Deferred to GYN  Rectal:    Extremities:   Extremities normal, atraumatic, no cyanosis or edema  Pulses:   2+ and symmetric all extremities  Skin:   Skin color, texture, turgor normal, sebaceous cyst at base of neck  Lymph nodes:   Cervical, supraclavicular, and  axillary nodes normal  Neurologic:   CNII-XII intact, normal strength, sensation and reflexes    throughout          Assessment & Plan:

## 2019-07-13 NOTE — Patient Instructions (Signed)
Follow up in 1 year or as needed We'll notify you of your lab results and make any changes if needed Apply warm compresses to the back of the neck Call Derm to drain/remove the area [facility] call you with your PT appt for the back Call with any questions or concerns Stay Safe!!!

## 2019-07-14 ENCOUNTER — Other Ambulatory Visit: Payer: Self-pay | Admitting: General Practice

## 2019-07-14 MED ORDER — VITAMIN D (ERGOCALCIFEROL) 1.25 MG (50000 UNIT) PO CAPS: 50000.0000 [IU] | ORAL_CAPSULE | ORAL | 0 refills | Status: DC

## 2019-08-10 ENCOUNTER — Telehealth: Payer: Self-pay

## 2019-08-10 ENCOUNTER — Ambulatory Visit (INDEPENDENT_AMBULATORY_CARE_PROVIDER_SITE_OTHER): Payer: BC Managed Care – PPO

## 2019-08-10 ENCOUNTER — Other Ambulatory Visit: Payer: Self-pay

## 2019-08-10 DIAGNOSIS — T452X4A Poisoning by vitamins, undetermined, initial encounter: Secondary | ICD-10-CM

## 2019-08-10 NOTE — Telephone Encounter (Signed)
Patient called in stating that she has been taking her Vit D 50,000 units daily for the past 3 weeks. Please advise on what patient needs to do

## 2019-08-10 NOTE — Telephone Encounter (Signed)
Stop it immediately. Need to get her in for VIt D level to make sure there is no toxicity present. Then we can determine from levels how to restart.

## 2019-08-10 NOTE — Telephone Encounter (Signed)
Patient is scheduled for lab only appt this afternoon

## 2019-08-11 LAB — VITAMIN D 25 HYDROXY (VIT D DEFICIENCY, FRACTURES): VITD: 53.83 ng/mL (ref 30.00–100.00)

## 2019-08-12 ENCOUNTER — Other Ambulatory Visit: Payer: Self-pay | Admitting: Emergency Medicine

## 2019-08-12 DIAGNOSIS — T452X4A Poisoning by vitamins, undetermined, initial encounter: Secondary | ICD-10-CM

## 2019-08-25 ENCOUNTER — Other Ambulatory Visit: Payer: Self-pay | Admitting: Family Medicine

## 2019-09-14 ENCOUNTER — Other Ambulatory Visit: Payer: Self-pay | Admitting: Family Medicine

## 2019-09-29 ENCOUNTER — Other Ambulatory Visit: Payer: Self-pay | Admitting: Family Medicine

## 2019-11-15 ENCOUNTER — Other Ambulatory Visit: Payer: Self-pay | Admitting: Family Medicine

## 2019-12-15 ENCOUNTER — Other Ambulatory Visit: Payer: Self-pay | Admitting: Family Medicine

## 2020-01-07 ENCOUNTER — Other Ambulatory Visit: Payer: Self-pay | Admitting: Family Medicine

## 2020-01-15 ENCOUNTER — Other Ambulatory Visit: Payer: Self-pay | Admitting: Family Medicine

## 2020-05-10 ENCOUNTER — Other Ambulatory Visit: Payer: Self-pay | Admitting: Family Medicine

## 2020-07-04 ENCOUNTER — Other Ambulatory Visit: Payer: Self-pay | Admitting: Family Medicine

## 2020-08-06 ENCOUNTER — Other Ambulatory Visit: Payer: Self-pay | Admitting: Family Medicine

## 2020-09-03 ENCOUNTER — Telehealth: Payer: Self-pay | Admitting: Family Medicine

## 2020-09-03 NOTE — Telephone Encounter (Signed)
Medication filled to pharmacy as requested.   

## 2020-09-03 NOTE — Telephone Encounter (Signed)
..  Medication Refills  Medication:Miloxicam 15 mg   [facility] [address] [facility] [place]  ** Let patient know to contact pharmacy at the end of the day to make sure medication is ready.**  ** Please notify patient to allow 48-72 hours to process.**  ** Encourage patient to contact the pharmacy for refills or they can request refills through [facility]**  Clinical Fills out below:   Last refill:  QTY:  Refill Date:    Other Comments:   Okay for refill?  Please advise.

## 2020-09-14 ENCOUNTER — Telehealth: Payer: Self-pay | Admitting: Family Medicine

## 2020-09-14 ENCOUNTER — Other Ambulatory Visit: Payer: Self-pay | Admitting: Family Medicine

## 2020-09-14 NOTE — Telephone Encounter (Signed)
Pt is scheduled for a cpe on 10/17/2020

## 2020-09-14 NOTE — Telephone Encounter (Signed)
Please advise, pt has not been seen in over a year.  

## 2020-09-14 NOTE — Telephone Encounter (Signed)
Medication was called in to the pharmacy again today. Please can we get pt scheduled for CPE if not already scheduled.

## 2020-09-14 NOTE — Telephone Encounter (Signed)
Ok for #30, no refills.  Needs to schedule CPE

## 2020-09-14 NOTE — Telephone Encounter (Signed)
Pt called in asking if we can res[facility] [address], pharmacy told pt they didn't receive the prescription from 09/13/20

## 2020-09-20 ENCOUNTER — Other Ambulatory Visit: Payer: Self-pay | Admitting: Family Medicine

## 2020-10-01 DIAGNOSIS — Z889 Allergy status to unspecified drugs, medicaments and biological substances status: Secondary | ICD-10-CM | POA: Insufficient documentation

## 2020-10-07 ENCOUNTER — Other Ambulatory Visit: Payer: Self-pay | Admitting: Family Medicine

## 2020-10-17 ENCOUNTER — Encounter: Payer: BC Managed Care – PPO | Admitting: Family Medicine

## 2020-11-08 LAB — HM PAP SMEAR

## 2020-11-20 ENCOUNTER — Encounter: Payer: Self-pay | Admitting: Family Medicine

## 2020-11-20 ENCOUNTER — Ambulatory Visit (INDEPENDENT_AMBULATORY_CARE_PROVIDER_SITE_OTHER): Payer: BC Managed Care – PPO | Admitting: Family Medicine

## 2020-11-20 ENCOUNTER — Other Ambulatory Visit: Payer: Self-pay

## 2020-11-20 VITALS — BP 118/74 | HR 76 | Temp 98.6°F | Resp 16 | Ht 65.0 in | Wt 183.6 lb

## 2020-11-20 DIAGNOSIS — Z23 Encounter for immunization: Secondary | ICD-10-CM

## 2020-11-20 DIAGNOSIS — J3081 Allergic rhinitis due to animal (cat) (dog) hair and dander: Secondary | ICD-10-CM | POA: Insufficient documentation

## 2020-11-20 DIAGNOSIS — J301 Allergic rhinitis due to pollen: Secondary | ICD-10-CM | POA: Insufficient documentation

## 2020-11-20 DIAGNOSIS — E669 Obesity, unspecified: Secondary | ICD-10-CM

## 2020-11-20 DIAGNOSIS — E559 Vitamin D deficiency, unspecified: Secondary | ICD-10-CM

## 2020-11-20 DIAGNOSIS — Z0000 Encounter for general adult medical examination without abnormal findings: Secondary | ICD-10-CM

## 2020-11-20 DIAGNOSIS — K219 Gastro-esophageal reflux disease without esophagitis: Secondary | ICD-10-CM | POA: Insufficient documentation

## 2020-11-20 DIAGNOSIS — Z114 Encounter for screening for human immunodeficiency virus [HIV]: Secondary | ICD-10-CM | POA: Diagnosis not present

## 2020-11-20 DIAGNOSIS — H1045 Other chronic allergic conjunctivitis: Secondary | ICD-10-CM | POA: Insufficient documentation

## 2020-11-20 DIAGNOSIS — J4540 Moderate persistent asthma, uncomplicated: Secondary | ICD-10-CM | POA: Insufficient documentation

## 2020-11-20 DIAGNOSIS — Z1159 Encounter for screening for other viral diseases: Secondary | ICD-10-CM

## 2020-11-20 LAB — BASIC METABOLIC PANEL
BUN: 16 mg/dL (ref 6–23)
CO2: 30 mEq/L (ref 19–32)
Calcium: 9.1 mg/dL (ref 8.4–10.5)
Chloride: 102 mEq/L (ref 96–112)
Creatinine, Ser: 0.63 mg/dL (ref 0.40–1.20)
GFR: 111.31 mL/min (ref >60.00)
Glucose, Bld: 71 mg/dL (ref 70–99)
Potassium: 4.3 mEq/L (ref 3.5–5.1)
Sodium: 137 mEq/L (ref 135–145)

## 2020-11-20 LAB — CBC WITH DIFFERENTIAL/PLATELET
Basophils Absolute: 0 10*3/uL (ref 0.0–0.1)
Basophils Relative: 0.6 % (ref 0.0–3.0)
Eosinophils Absolute: 0.2 10*3/uL (ref 0.0–0.7)
Eosinophils Relative: 3.4 % (ref 0.0–5.0)
HCT: 37.9 % (ref 36.0–46.0)
Hemoglobin: 12.4 g/dL (ref 12.0–15.0)
Lymphocytes Relative: 40 % (ref 12.0–46.0)
Lymphs Abs: 1.9 10*3/uL (ref 0.7–4.0)
MCHC: 32.7 g/dL (ref 30.0–36.0)
MCV: 90.5 fl (ref 78.0–100.0)
Monocytes Absolute: 0.6 10*3/uL (ref 0.1–1.0)
Monocytes Relative: 12 % (ref 3.0–12.0)
Neutro Abs: 2 10*3/uL (ref 1.4–7.7)
Neutrophils Relative %: 44 % (ref 43.0–77.0)
Platelets: 284 10*3/uL (ref 150.0–400.0)
RBC: 4.19 Mil/uL (ref 3.87–5.11)
RDW: 13.7 % (ref 11.5–15.5)
WBC: 4.6 10*3/uL (ref 4.0–10.5)

## 2020-11-20 LAB — HEPATIC FUNCTION PANEL
ALT: 15 U/L (ref 0–35)
AST: 17 U/L (ref 0–37)
Albumin: 4.2 g/dL (ref 3.5–5.2)
Alkaline Phosphatase: 59 U/L (ref 39–117)
Bilirubin, Direct: 0.1 mg/dL (ref 0.0–0.3)
Total Bilirubin: 0.3 mg/dL (ref 0.2–1.2)
Total Protein: 7 g/dL (ref 6.0–8.3)

## 2020-11-20 LAB — LIPID PANEL
Cholesterol: 150 mg/dL (ref 0–200)
HDL: 39.7 mg/dL (ref >39.00)
LDL Cholesterol: 92 mg/dL (ref 0–99)
NonHDL: 109.9
Total CHOL/HDL Ratio: 4
Triglycerides: 89 mg/dL (ref 0.0–149.0)
VLDL: 17.8 mg/dL (ref 0.0–40.0)

## 2020-11-20 LAB — TSH: TSH: 1.21 u[IU]/mL (ref 0.35–4.50)

## 2020-11-20 LAB — VITAMIN D 25 HYDROXY (VIT D DEFICIENCY, FRACTURES): VITD: 39.31 ng/mL (ref 30.00–100.00)

## 2020-11-20 NOTE — Patient Instructions (Addendum)
Follow up in 1 year or as needed We'll notify you of your lab results and make any changes if needed Continue to work on healthy diet and regular exercise- you can do it! Call with any questions or concerns Stay Safe!  Stay Healthy! Happy Holidays! HAPPY BIRTHDAY!!!

## 2020-11-20 NOTE — Assessment & Plan Note (Signed)
Check labs and replete prn. 

## 2020-11-20 NOTE — Assessment & Plan Note (Signed)
Pt's PE WNL w/ exception of obesity.  UTD on pap, COVID.  Flu given today.  Will get Tdap at next visit.  Check labs.  Anticipatory guidance provided.

## 2020-11-20 NOTE — Assessment & Plan Note (Signed)
Pt's BMI is now 30.55  Encouraged healthy diet and regular exercise.  Check labs to risk stratify.

## 2020-11-20 NOTE — Progress Notes (Signed)
   Subjective:    Patient ID: Crystal Sims, female    DOB: [birth year], 39 y.o.   MRN: [id number]  HPI CPE- UTD on pap, COVID vaccines.  Due for flu (will do today), Tdap.  + weight gain- up 4 lbs  Reviewed past medical, surgical, family and social histories.   Patient Care Team    Relationship Specialty Notifications Start End  Sheliah Hatch, MD PCP - General Family Medicine  09/02/12   Sidney Ace, MD Referring Physician Allergy  10/28/17   Mitchel Honour, DO Consulting Physician Obstetrics and Gynecology  07/13/19     Health Maintenance  Topic Date Due  . Hepatitis C Screening  Never done  . HIV Screening  Never done  . TETANUS/TDAP  Never done  . INFLUENZA VACCINE  07/01/2020  . PAP SMEAR-Modifier  11/09/2023  . COVID-19 Vaccine  Completed      Review of Systems Patient reports no vision/ hearing changes, adenopathy,fever, persistant/recurrent hoarseness , swallowing issues, chest pain, palpitations, edema, persistant/recurrent cough, hemoptysis, dyspnea (rest/exertional/paroxysmal nocturnal), gastrointestinal bleeding (melena, rectal bleeding), abdominal pain, significant heartburn, bowel changes, GU symptoms (dysuria, hematuria, incontinence), Gyn symptoms (abnormal  bleeding, pain),  syncope, focal weakness, memory loss, numbness & tingling, skin/hair/nail changes, abnormal bruising or bleeding, anxiety, or depression.   This visit occurred during the SARS-CoV-2 public health emergency.  Safety protocols were in place, including screening questions prior to the visit, additional usage of staff PPE, and extensive cleaning of exam room while observing appropriate contact time as indicated for disinfecting solutions.       Objective:   Physical Exam General Appearance:    Alert, cooperative, no distress, appears stated age, obese  Head:    Normocephalic, without obvious abnormality, atraumatic  Eyes:    PERRL, conjunctiva/corneas clear, EOM's intact, fundi    benign,  both eyes  Ears:    Normal TM's and external ear canals, both ears  Nose:   Deferred due to COVID  Throat:   Neck:   Supple, symmetrical, trachea midline, no adenopathy;    Thyroid: no enlargement/tenderness/nodules  Back:     Symmetric, no curvature, ROM normal, no CVA tenderness  Lungs:     Clear to auscultation bilaterally, respirations unlabored  Chest Wall:    No tenderness or deformity   Heart:    Regular rate and rhythm, S1 and S2 normal, no murmur, rub   or gallop  Breast Exam:    Deferred to GYN  Abdomen:     Soft, non-tender, bowel sounds active all four quadrants,    no masses, no organomegaly  Genitalia:    Deferred to GYN  Rectal:    Extremities:   Extremities normal, atraumatic, no cyanosis or edema  Pulses:   2+ and symmetric all extremities  Skin:   Skin color, texture, turgor normal, no rashes or lesions  Lymph nodes:   Cervical, supraclavicular, and axillary nodes normal  Neurologic:   CNII-XII intact, normal strength, sensation and reflexes    throughout          Assessment & Plan:

## 2020-11-21 ENCOUNTER — Telehealth: Payer: Self-pay | Admitting: Family Medicine

## 2020-11-21 LAB — HEPATITIS C ANTIBODY
Hepatitis C Ab: NONREACTIVE
SIGNAL TO CUT-OFF: 0.25 (ref <1.00)

## 2020-11-21 LAB — HIV ANTIBODY (ROUTINE TESTING W REFLEX): HIV 1&2 Ab, 4th Generation: NONREACTIVE

## 2020-11-21 NOTE — Telephone Encounter (Signed)
Pt stated she saw her doctor yesterday and they update her vaccine. Pt received moderna for all 3 vaccines

## 2021-01-12 ENCOUNTER — Other Ambulatory Visit: Payer: Self-pay | Admitting: Family Medicine

## 2021-03-28 ENCOUNTER — Other Ambulatory Visit: Payer: Self-pay | Admitting: Family Medicine

## 2021-05-17 ENCOUNTER — Other Ambulatory Visit: Payer: Self-pay | Admitting: Family Medicine

## 2021-05-29 ENCOUNTER — Encounter: Payer: Self-pay | Admitting: *Deleted

## 2021-06-19 ENCOUNTER — Encounter: Payer: Self-pay | Admitting: Registered Nurse

## 2021-06-19 ENCOUNTER — Telehealth (INDEPENDENT_AMBULATORY_CARE_PROVIDER_SITE_OTHER): Payer: BC Managed Care – PPO | Admitting: Registered Nurse

## 2021-06-19 ENCOUNTER — Other Ambulatory Visit: Payer: Self-pay

## 2021-06-19 VITALS — Temp 97.6°F

## 2021-06-19 DIAGNOSIS — U071 COVID-19: Secondary | ICD-10-CM | POA: Diagnosis not present

## 2021-06-19 MED ORDER — BENZONATATE 200 MG PO CAPS: 200.0000 mg | ORAL_CAPSULE | Freq: Two times a day (BID) | ORAL | 0 refills | Status: DC | PRN

## 2021-06-19 MED ORDER — BENZONATATE 200 MG PO CAPS
200.0000 mg | ORAL_CAPSULE | Freq: Two times a day (BID) | ORAL | 0 refills | Status: DC | PRN
Start: 2021-06-19 — End: 2021-06-27

## 2021-06-19 MED ORDER — NIRMATRELVIR/RITONAVIR (PAXLOVID)TABLET: 3.0000 | ORAL_TABLET | Freq: Two times a day (BID) | ORAL | 0 refills | Status: DC

## 2021-06-19 MED ORDER — NIRMATRELVIR/RITONAVIR (PAXLOVID)TABLET: 3.0000 | ORAL_TABLET | Freq: Two times a day (BID) | ORAL | 0 refills | Status: AC

## 2021-06-19 MED ORDER — HYDROCODONE BIT-HOMATROP MBR 5-1.5 MG/5ML PO SOLN: 5.0000 mL | Freq: Three times a day (TID) | ORAL | 0 refills | Status: DC | PRN

## 2021-06-19 NOTE — Patient Instructions (Signed)
° ° ° °  If you have lab work done today you will be contacted with your lab results within the next 2 weeks.  If you have not heard from us then please contact us. The fastest way to get your results is to register for My Chart. ° ° °IF you received an x-ray today, you will receive an invoice from [facility]. Please contact  Radiology at [phone] with questions or concerns regarding your invoice.  ° °IF you received labwork today, you will receive an invoice from [facility]. Please contact [facility] at [phone] with questions or concerns regarding your invoice.  ° °Our billing staff will not be able to assist you with questions regarding bills from these companies. ° °You will be contacted with the lab results as soon as they are available. The fastest way to get your results is to activate your My Chart account. Instructions are located on the last page of this paperwork. If you have not heard from us regarding the results in 2 weeks, please contact this office. °  ° ° ° °

## 2021-06-19 NOTE — Progress Notes (Signed)
Telemedicine Encounter- SOAP NOTE Established Patient  This video encounter was conducted with the patient's (or proxy's) verbal consent via audio telecommunications: yes/no: Yes Patient was instructed to have this encounter in a suitably private space; and to only have persons present to whom they give permission to participate. In addition, patient identity was confirmed by use of name plus two identifiers (DOB and address).  I discussed the limitations, risks, security and privacy concerns of performing an evaluation and management service by telephone and the availability of in person appointments. I also discussed with the patient that there may be a patient responsible charge related to this service. The patient expressed understanding and agreed to proceed.  I spent a total of 13 minutes talking with the patient or their proxy.  Patient at home Provider in office  Participants: Jari Sportsman, NP and [name]  Chief Complaint  Patient presents with   Covid Positive    Patient states she took a rapid covid test 06/17/2021. Patient is experiencing congestion, coughing,headache, sneezing and coughing. At the beginning she also had fever,chills and body aches. She has been talking tylenol , sudafed, cough medications     Subjective   Crystal Sims is a 40 y.o. established patient. Telephone visit today for COVID  HPI Symptoms started on 06/15/21 through the day Frequent hx of sinus pressure, pnd Then sore throat Sunday, fever, coughing, headache Some mild body aches Took test on July 19 - positive Symptoms stable to improving  Has taken tylenol, sudafed, and otc cough suppressant. Some good effect.  Moderna vaccine x 2, booster x1  Patient Active Problem List   Diagnosis Date Noted   Allergic rhinitis due to animal (cat) (dog) hair and dander 11/20/2020   Allergic rhinitis due to pollen 11/20/2020   Chronic allergic conjunctivitis 11/20/2020   Gastro-esophageal reflux  disease without esophagitis 11/20/2020   Moderate persistent asthma, uncomplicated 11/20/2020   Vitamin D deficiency 11/20/2020   Obesity (BMI 30-39.9) 07/13/2019   Right foot pain 01/26/2018   Hematuria 11/11/2015   Folliculitis 05/17/2015   Allergic rhinitis 05/17/2015   Neck pain, bilateral posterior 04/18/2015   Routine general medical examination at a health care facility 09/02/2012    Past Medical History:  Diagnosis Date   Allergy    Chicken pox    Environmental allergies    Scoliosis    Seizures (HCC) infancy   Noted only occured once with a fever during infancy   Sinusitis    recurrent    Current Outpatient Medications  Medication Sig Dispense Refill   azelastine (ASTELIN) 0.1 % nasal spray Place into both nostrils 2 (two) times daily. Use in each nostril as directed     BREO ELLIPTA 100-25 MCG/INH AEPB 1 PUFF ONCE A DAY INHALATION 30 DAYS  6   EPINEPHrine 0.3 mg/0.3 mL IJ SOAJ injection See admin instructions.     ipratropium (ATROVENT) 0.03 % nasal spray Place 2 sprays into both nostrils 2 (two) times daily. 30 mL 0   IRON PO Take 1 tablet by mouth daily.     levocetirizine (XYZAL) 5 MG tablet   3   meloxicam (MOBIC) 15 MG tablet TAKE 1 TABLET BY MOUTH EVERY DAY 30 tablet 0   mometasone (NASONEX) 50 MCG/ACT nasal spray PLACE 2 SPRAYS INTO THE NOSE DAILY. 17 each 1   montelukast (SINGULAIR) 10 MG tablet   3   Multiple Vitamin (MULTIVITAMIN) tablet Take 1 tablet by mouth daily. Reported on 01/05/2016  spironolactone (ALDACTONE) 100 MG tablet Take 100 mg by mouth daily.     tretinoin (RETIN-A) 0.025 % cream Apply topically at bedtime.     Vitamin D, Ergocalciferol, (DRISDOL) 1.25 MG (50000 UT) CAPS capsule Take 1 capsule (50,000 Units total) by mouth every 7 (seven) days. (Patient not taking: Reported on 06/19/2021) 12 capsule 0   No current facility-administered medications for this visit.    No Known Allergies  Social History   Socioeconomic History   Marital  status: Single    Spouse name: Not on file   Number of children: Not on file   Years of education: Not on file   Highest education level: Not on file  Occupational History   Not on file  Tobacco Use   Smoking status: Never   Smokeless tobacco: Never  Vaping Use   Vaping Use: Never used  Substance and Sexual Activity   Alcohol use: No   Drug use: No   Sexual activity: Not on file  Other Topics Concern   Not on file  Social History Narrative   Not on file   Social Determinants of Health   Financial Resource Strain: Not on file  Food Insecurity: Not on file  Transportation Needs: Not on file  Physical Activity: Not on file  Stress: Not on file  Social Connections: Not on file  Intimate Partner Violence: Not on file    ROS Per hpi , otherwise negative  Objective   Vitals as reported by the patient: Today's Vitals   06/19/21 0859  Temp: 97.6 F (36.4 C)  TempSrc: Temporal    [name] was seen today for covid positive.  Diagnoses and all orders for this visit:  COVID-19 -     Discontinue: nirmatrelvir/ritonavir EUA (PAXLOVID) TABS; Take 3 tablets by mouth 2 (two) times daily for 5 days. (Take nirmatrelvir 150 mg two tablets twice daily for 5 days and ritonavir 100 mg one tablet twice daily for 5 days) Patient GFR is 111 -     Discontinue: HYDROcodone bit-homatropine (HYCODAN) 5-1.5 MG/5ML syrup; Take 5 mLs by mouth every 8 (eight) hours as needed for cough. -     Discontinue: benzonatate (TESSALON) 200 MG capsule; Take 1 capsule (200 mg total) by mouth 2 (two) times daily as needed for cough. -     nirmatrelvir/ritonavir EUA (PAXLOVID) TABS; Take 3 tablets by mouth 2 (two) times daily for 5 days. (Take nirmatrelvir 150 mg two tablets twice daily for 5 days and ritonavir 100 mg one tablet twice daily for 5 days) Patient GFR is 111 -     HYDROcodone bit-homatropine (HYCODAN) 5-1.5 MG/5ML syrup; Take 5 mLs by mouth every 8 (eight) hours as needed for cough. -      benzonatate (TESSALON) 200 MG capsule; Take 1 capsule (200 mg total) by mouth 2 (two) times daily as needed for cough.   PLAN Discussed risks and benefits of antiviral medication and will proceed with paxlovid as above. Supportive care as above. Discussed return and ER precautions with patient who voices understanding. Reviewed CDC isolation and PPE guidelines Patient encouraged to call clinic with any questions, comments, or concerns.  I discussed the assessment and treatment plan with the patient. The patient was provided an opportunity to ask questions and all were answered. The patient agreed with the plan and demonstrated an understanding of the instructions.   The patient was advised to call back or seek an in-person evaluation if the symptoms worsen or if the condition fails to  improve as anticipated.  I provided 13 minutes of non-face-to-face time during this encounter.  Janeece Agee, NP

## 2021-06-21 ENCOUNTER — Telehealth: Payer: Self-pay | Admitting: Family Medicine

## 2021-06-21 NOTE — Telephone Encounter (Signed)
Patient was seen by Gerlene Burdock on Wednesday - he told her that if she was not feeling better by this morning to call back and he would send an antibiotic - Patient states that she is still not feeling well. Please advise.

## 2021-06-21 NOTE — Telephone Encounter (Signed)
Pt reports you told her to call back for an Abx this morning if not better from Wed. Pt reports no better.

## 2021-06-24 ENCOUNTER — Other Ambulatory Visit: Payer: Self-pay | Admitting: Registered Nurse

## 2021-06-24 DIAGNOSIS — J069 Acute upper respiratory infection, unspecified: Secondary | ICD-10-CM

## 2021-06-24 MED ORDER — AMOXICILLIN-POT CLAVULANATE 875-125 MG PO TABS: 1.0000 | ORAL_TABLET | Freq: Two times a day (BID) | ORAL | 0 refills | Status: DC

## 2021-06-24 NOTE — Telephone Encounter (Signed)
Sent Thanks Rich

## 2021-06-24 NOTE — Telephone Encounter (Signed)
Pt is reporting she is no better and wanting to know if something else can be called in she is having drainage which is causing her to cough now. Richard told her to call back is no better and she needs something else.   Today is her last dosage of nirmatrelvir/ritonavir EUA (PAXLOVID) TABS  Pt was also given benzonatate (TESSALON) 200 MG capsule , HYDROcodone bit-homatropine (HYCODAN) 5-1.5 MG/5ML syrup [[id number]] the two cough surps help but the drainage is making it worse  Pt call back [phone]

## 2021-06-27 ENCOUNTER — Telehealth (INDEPENDENT_AMBULATORY_CARE_PROVIDER_SITE_OTHER): Payer: BC Managed Care – PPO | Admitting: Family Medicine

## 2021-06-27 ENCOUNTER — Encounter: Payer: Self-pay | Admitting: Family Medicine

## 2021-06-27 DIAGNOSIS — U071 COVID-19: Secondary | ICD-10-CM

## 2021-06-27 MED ORDER — ALBUTEROL SULFATE HFA 108 (90 BASE) MCG/ACT IN AERS: 2.0000 | INHALATION_SPRAY | Freq: Four times a day (QID) | RESPIRATORY_TRACT | 0 refills | Status: DC | PRN

## 2021-06-27 MED ORDER — BENZONATATE 200 MG PO CAPS: 200.0000 mg | ORAL_CAPSULE | Freq: Two times a day (BID) | ORAL | 0 refills | Status: DC | PRN

## 2021-06-27 NOTE — Progress Notes (Signed)
Virtual Visit via Video Note  I connected with Crystal Sims  on 06/27/21 at  5:40 PM EDT by a video enabled telemedicine application and verified that I am speaking with the correct person using two identifiers.  Location patient: home, [place] Location provider:work or home office Persons participating in the virtual visit: patient, provider  I discussed the limitations of evaluation and management by telemedicine and the availability of in person appointments. The patient expressed understanding and agreed to proceed.   HPI:  Acute telemedicine visit for Covid19: -Onset: about 12 days ago -Symptoms include: reports was seen by PCP 7/20 and was treated with paxlovid and a cough medication -reports overall has been doing much better -however has persistent PND, cough and occ sensation of needing to take a deep breath (denies SOB or CP.) Reports PCP provided abx for this but it has not helped. -reports has used albuterol in the past -worsening, fever, covid test is now negative, malaise -Pertinent past medical history:allergy, sinus issues and -Pertinent medication allergies:No Known Allergies -COVID-19 vaccine status: vaccinated x2  ROS: See pertinent positives and negatives per HPI.  Past Medical History:  Diagnosis Date   Allergy    Chicken pox    Environmental allergies    Scoliosis    Seizures (HCC) infancy   Noted only occured once with a fever during infancy   Sinusitis    recurrent    History reviewed. No pertinent surgical history.   Current Outpatient Medications:    albuterol (PROAIR HFA) 108 (90 Base) MCG/ACT inhaler, Inhale 2 puffs into the lungs every 6 (six) hours as needed for wheezing or shortness of breath., Disp: 1 each, Rfl: 0   amoxicillin-clavulanate (AUGMENTIN) 875-125 MG tablet, Take 1 tablet by mouth 2 (two) times daily., Disp: 20 tablet, Rfl: 0   benzonatate (TESSALON) 200 MG capsule, Take 1 capsule (200 mg total) by mouth 2 (two) times daily as needed for  cough., Disp: 20 capsule, Rfl: 0   BREO ELLIPTA 100-25 MCG/INH AEPB, 1 PUFF ONCE A DAY INHALATION 30 DAYS, Disp: , Rfl: 6   EPINEPHrine 0.3 mg/0.3 mL IJ SOAJ injection, See admin instructions., Disp: , Rfl:    ipratropium (ATROVENT) 0.03 % nasal spray, Place 2 sprays into both nostrils 2 (two) times daily., Disp: 30 mL, Rfl: 0   IRON PO, Take 1 tablet by mouth daily., Disp: , Rfl:    levocetirizine (XYZAL) 5 MG tablet, , Disp: , Rfl: 3   meloxicam (MOBIC) 15 MG tablet, TAKE 1 TABLET BY MOUTH EVERY DAY (Patient taking differently: as needed.), Disp: 30 tablet, Rfl: 0   mometasone (NASONEX) 50 MCG/ACT nasal spray, PLACE 2 SPRAYS INTO THE NOSE DAILY., Disp: 17 each, Rfl: 1   montelukast (SINGULAIR) 10 MG tablet, , Disp: , Rfl: 3   Multiple Vitamin (MULTIVITAMIN) tablet, Take 1 tablet by mouth daily. Reported on 01/05/2016, Disp: , Rfl:    spironolactone (ALDACTONE) 100 MG tablet, Take 100 mg by mouth daily., Disp: , Rfl:    tretinoin (RETIN-A) 0.025 % cream, Apply topically at bedtime., Disp: , Rfl:    VITAMIN D PO, Take by mouth daily., Disp: , Rfl:   EXAM:  VITALS per patient if applicable:  GENERAL: alert, oriented, appears well and in no acute distress  HEENT: atraumatic, conjunttiva clear, no obvious abnormalities on inspection of external nose and ears  NECK: normal movements of the head and neck  LUNGS: on inspection no signs of respiratory distress, breathing rate appears normal, no obvious gross SOB,  gasping or wheezing  CV: no obvious cyanosis  MS: moves all visible extremities without noticeable abnormality  PSYCH/NEURO: pleasant and cooperative, no obvious depression or anxiety, speech and thought processing grossly intact  ASSESSMENT AND PLAN:  Discussed the following assessment and plan:  COVID-19  -we discussed possible serious and likely etiologies, options for evaluation and workup, limitations of telemedicine visit vs in person visit, treatment, treatment risks and  precautions. Pt prefers to treat via telemedicine empirically rather than in person at this moment. Suspect post covid /viral, recoviering vs asthma vs other. Opted for refill on cough medication Tessalon and alb prn q6 hours. Did discuss possibility of complications/rebound.  Advised to seek prompt in person care if worsening, new symptoms arise, or if is not improving with treatment. Discussed options for inperson care if PCP office not available. Did let this patient know that I only do telemedicine on Tuesdays and Thursdays for [place]. Advised to schedule follow up visit with PCP or [facility] if any further questions or concerns to avoid delays in care.   I discussed the assessment and treatment plan with the patient. The patient was provided an opportunity to ask questions and all were answered. The patient agreed with the plan and demonstrated an understanding of the instructions.     Terressa Koyanagi, DO

## 2021-06-27 NOTE — Patient Instructions (Signed)
  HOME CARE TIPS:   -I sent the medication(s) we discussed to your pharmacy: Meds ordered this encounter  Medications   albuterol (PROAIR HFA) 108 (90 Base) MCG/ACT inhaler    Sig: Inhale 2 puffs into the lungs every 6 (six) hours as needed for wheezing or shortness of breath.    Dispense:  1 each    Refill:  0   benzonatate (TESSALON) 200 MG capsule    Sig: Take 1 capsule (200 mg total) by mouth 2 (two) times daily as needed for cough.    Dispense:  20 capsule    Refill:  0     -stay hydrated, drink plenty of fluids and eat small healthy meals - avoid dairy  -follow up with your doctor in 2-3 days unless improving and feeling better  -stay home while sick, except to seek medical care. If you have COVID19, ideally it would be best to stay home for a full 10 days since the onset of symptoms PLUS one day of no fever and feeling better. Wear a good mask that fits snugly (such as N95 or KN95) if around others to reduce the risk of transmission.  It was nice to meet you today, and I really hope you are feeling better soon. I help [facility] out with telemedicine visits on Tuesdays and Thursdays and am available for visits on those days. If you have any concerns or questions following this visit please schedule a follow up visit with your Primary Care doctor or seek care at a local urgent care clinic to avoid delays in care.    Seek in person care or schedule a follow up video visit promptly if your symptoms worsen, new concerns arise or you are not improving with treatment. Call 911 and/or seek emergency care if your symptoms are severe or life threatening.

## 2021-07-08 ENCOUNTER — Encounter: Payer: Self-pay | Admitting: Family Medicine

## 2021-07-08 ENCOUNTER — Telehealth (INDEPENDENT_AMBULATORY_CARE_PROVIDER_SITE_OTHER): Payer: BC Managed Care – PPO | Admitting: Family Medicine

## 2021-07-08 ENCOUNTER — Other Ambulatory Visit: Payer: Self-pay

## 2021-07-08 DIAGNOSIS — R059 Cough, unspecified: Secondary | ICD-10-CM

## 2021-07-08 DIAGNOSIS — R0982 Postnasal drip: Secondary | ICD-10-CM

## 2021-07-08 DIAGNOSIS — U071 COVID-19: Secondary | ICD-10-CM | POA: Diagnosis not present

## 2021-07-08 MED ORDER — BENZONATATE 200 MG PO CAPS: 200.0000 mg | ORAL_CAPSULE | Freq: Two times a day (BID) | ORAL | 0 refills | Status: DC | PRN

## 2021-07-08 MED ORDER — PREDNISONE 20 MG PO TABS
40.0000 mg | ORAL_TABLET | Freq: Every day | ORAL | 0 refills | Status: DC
Start: 2021-07-08 — End: 2021-11-22

## 2021-07-08 NOTE — Patient Instructions (Addendum)
Continue to try saline spray during day along with your usual allergy meds. Prednisone may help and lessen the need for albuterol, but ok to still use if needed.  Continue tessalon perles for now.  If not improving in next week can see you in person. Sooner if worse.  Return to the clinic or go to the nearest emergency room if any of your symptoms worsen or new symptoms occur.  Cough, Adult Coughing is a reflex that clears your throat and your airways (respiratory system). Coughing helps to heal and protect your lungs. It is normal to cough occasionally, but a cough that happens with other symptoms or lasts a long time may be a sign of a condition that needs treatment. An acute cough may only last2-3 weeks, while a chronic cough may last 8 or more weeks. Coughing is commonly caused by: Infection of the respiratory systemby viruses or bacteria. Breathing in substances that irritate your lungs. Allergies. Asthma. Mucus that runs down the back of your throat (postnasal drip). Smoking. Acid backing up from the stomach into the esophagus (gastroesophageal reflux). Certain medicines. Chronic lung problems. Other medical conditions such as heart failure or a blood clot in the lung (pulmonary embolism). Follow these instructions at home: Medicines Take over-the-counter and prescription medicines only as told by your health care provider. Talk with your health care provider before you take a cough suppressant medicine. Lifestyle  Avoid cigarette smoke. Do not use any products that contain nicotine or tobacco, such as cigarettes, e-cigarettes, and chewing tobacco. If you need help quitting, ask your health care provider. Drink enough fluid to keep your urine pale yellow. Avoid caffeine. Do not drink alcohol if your health care provider tells you not to drink.  General instructions  Pay close attention to changes in your cough. Tell your health care provider about them. Always cover your mouth  when you cough. Avoid things that make you cough, such as perfume, candles, cleaning products, or campfire or tobacco smoke. If the air is dry, use a cool mist vaporizer or humidifier in your bedroom or your home to help loosen secretions. If your cough is worse at night, try to sleep in a semi-upright position. Rest as needed. Keep all follow-up visits as told by your health care provider. This is important.  Contact a health care provider if you: Have new symptoms. Cough up pus. Have a cough that does not get better after 2-3 weeks or gets worse. Cannot control your cough with cough suppressant medicines and you are losing sleep. Have pain that gets worse or pain that is not helped with medicine. Have a fever. Have unexplained weight loss. Have night sweats. Get help right away if: You cough up blood. You have difficulty breathing. Your heartbeat is very fast. These symptoms may represent a serious problem that is an emergency. Do not wait to see if the symptoms will go away. Get medical help right away. Call your local emergency services (911 in the [place] Do not drive yourself to the hospital. Summary Coughing is a reflex that clears your throat and your airways. It is normal to cough occasionally, but a cough that happens with other symptoms or lasts a long time may be a sign of a condition that needs treatment. Take over-the-counter and prescription medicines only as told by your health care provider. Always cover your mouth when you cough. Contact a health care provider if you have new symptoms or a cough that does not get better after 2-3  weeks or gets worse. This information is not intended to replace advice given to you by your health care provider. Make sure you discuss any questions you have with your healthcare provider. Document Revised: 12/06/2018 Document Reviewed: 12/06/2018 Elsevier Patient Education  2022 [employer].

## 2021-07-08 NOTE — Progress Notes (Signed)
Virtual Visit via Video Note  I connected with Crystal Sims on 07/08/21 at 5:02 PM by a video enabled telemedicine application and verified that I am speaking with the correct person using two identifiers.  Patient location:car My location: office [place]   I discussed the limitations, risks, security and privacy concerns of performing an evaluation and management service by telephone and the availability of in person appointments. I also discussed with the patient that there may be a patient responsible charge related to this service. The patient expressed understanding and agreed to proceed, consent obtained  Chief complaint:  Chief Complaint  Patient presents with   Covid Positive    Positive COVID test 7/19 positive saw Rich for Paxlovid. Saw another provider for inhaler end of July, Felt better but still unable to get cough and drainage to stop completley      History of Present Illness: Crystal Sims is a 40 y.o. female  COVID-19 infection, cough Chart reviewed.   Video visit July 20.  Initial symptoms July 16 with cough, fever, headache, sore throat and sinus pressure.  Positive COVID test on July 19.  Symptoms stable to improving at that time, prescribed Paxlovid, Tessalon Perles, hydrocodone cough syrup.  Telephone note July 22, persistent cough with drainage.  Started on Augmentin- minimal relief .   Video visit July 28.  Persistent postnasal drip, cough, occasional sensation of needing to take a deep breath but no shortness of breath or chest pain.  Tessalon Perles, albuterol prescribed.   Did use albuterol - helps with cough. Using 2 times per day. Same symptoms prior - feeling like can't get a deep breath. Once 3 times per day.  Feels PND at night. Scratchy throat, but better during day. Persistent clear drainage. Tessalon perles tid - needs refill. Taking usual allergy meds. Has allergist and ENT - did make appt with ENT. ? Plan for possible turbinate reduction. Allergy  better controlled prior to covid. Has tolerated prednisone in past. No fever, no dyspnea.      Patient Active Problem List   Diagnosis Date Noted   Allergic rhinitis due to animal (cat) (dog) hair and dander 11/20/2020   Allergic rhinitis due to pollen 11/20/2020   Chronic allergic conjunctivitis 11/20/2020   Gastro-esophageal reflux disease without esophagitis 11/20/2020   Moderate persistent asthma, uncomplicated 11/20/2020   Vitamin D deficiency 11/20/2020   Obesity (BMI 30-39.9) 07/13/2019   Right foot pain 01/26/2018   Hematuria 11/11/2015   Folliculitis 05/17/2015   Allergic rhinitis 05/17/2015   Neck pain, bilateral posterior 04/18/2015   Routine general medical examination at a health care facility 09/02/2012   Past Medical History:  Diagnosis Date   Allergy    Chicken pox    Environmental allergies    Scoliosis    Seizures (HCC) infancy   Noted only occured once with a fever during infancy   Sinusitis    recurrent   No past surgical history on file. No Known Allergies Prior to Admission medications   Medication Sig Start Date End Date Taking? Authorizing Provider  albuterol (PROAIR HFA) 108 (90 Base) MCG/ACT inhaler Inhale 2 puffs into the lungs every 6 (six) hours as needed for wheezing or shortness of breath. 06/27/21   Terressa Koyanagi, DO  amoxicillin-clavulanate (AUGMENTIN) 875-125 MG tablet Take 1 tablet by mouth 2 (two) times daily. 06/24/21   Janeece Agee, NP  benzonatate (TESSALON) 200 MG capsule Take 1 capsule (200 mg total) by mouth 2 (two) times daily as needed for  cough. 06/27/21   Kriste Basque R, DO  BREO ELLIPTA 100-25 MCG/INH AEPB 1 PUFF ONCE A DAY INHALATION 30 DAYS 07/15/16   [provider]  EPINEPHrine 0.3 mg/0.3 mL IJ SOAJ injection See admin instructions.    [provider]  ipratropium (ATROVENT) 0.03 % nasal spray Place 2 sprays into both nostrils 2 (two) times daily. 01/05/16   Peyton Najjar, MD  IRON PO Take 1 tablet by mouth  daily.    [provider]  levocetirizine (XYZAL) 5 MG tablet  03/10/15   [provider]  meloxicam (MOBIC) 15 MG tablet TAKE 1 TABLET BY MOUTH EVERY DAY Patient taking differently: as needed. 09/14/19   Sheliah Hatch, MD  mometasone (NASONEX) 50 MCG/ACT nasal spray PLACE 2 SPRAYS INTO THE NOSE DAILY. 05/17/21   Sheliah Hatch, MD  montelukast (SINGULAIR) 10 MG tablet  03/10/15   [provider]  Multiple Vitamin (MULTIVITAMIN) tablet Take 1 tablet by mouth daily. Reported on 01/05/2016    [provider]  spironolactone (ALDACTONE) 100 MG tablet Take 100 mg by mouth daily.    [provider]  tretinoin (RETIN-A) 0.025 % cream Apply topically at bedtime.    [provider]  VITAMIN D PO Take by mouth daily.    [provider]   Social History   Socioeconomic History   Marital status: Single    Spouse name: Not on file   Number of children: Not on file   Years of education: Not on file   Highest education level: Not on file  Occupational History   Not on file  Tobacco Use   Smoking status: Never   Smokeless tobacco: Never  Vaping Use   Vaping Use: Never used  Substance and Sexual Activity   Alcohol use: No   Drug use: No   Sexual activity: Not on file  Other Topics Concern   Not on file  Social History Narrative   Not on file   Social Determinants of Health   Financial Resource Strain: Not on file  Food Insecurity: Not on file  Transportation Needs: Not on file  Physical Activity: Not on file  Stress: Not on file  Social Connections: Not on file  Intimate Partner Violence: Not on file    Observations/Objective: There were no vitals filed for this visit. Speaking full sentences, no apparent respiratory distress, nontoxic appearance on video.  Few episodes of cough during video visit, brief.  Euthymic mood.  All questions were answered with understanding of plan expressed.  Assessment and Plan: Cough -  Plan: predniSONE (DELTASONE) 20 MG tablet  PND (post-nasal drip) - Plan: benzonatate (TESSALON) 200 MG capsule  COVID-19 COVID-19 infection last month, some persistent cough, likely postinfectious cough coupled with her underlying allergic rhinitis, and likely reactive airway/asthma irritant.  Persistent need for albuterol but upper airway irritation likely from postnasal drip.  Again may be component of prior COVID-19 infection as well as allergies.  No fevers, no shortness of breath, no red flags on history or video exam.  Deferred imaging or in person exam at this time.  -Start prednisone, 40 mg daily x5 days, potential side effects and risks were discussed  -Continue Tessalon Perles  -Saline nasal spray throughout the day to help with congestion, continue allergy meds  -RTC precautions if not improving the next week.  Follow Up Instructions: If not improving in the next week.  ER precautions.   I discussed the assessment and treatment plan with the  patient. The patient was provided an opportunity to ask questions and all were answered. The patient agreed with the plan and demonstrated an understanding of the instructions.   The patient was advised to call back or seek an in-person evaluation if the symptoms worsen or if the condition fails to improve as anticipated.  I provided 22 minutes of non-face-to-face time during this encounter.   Shade Flood, MD

## 2021-07-17 ENCOUNTER — Encounter: Payer: Self-pay | Admitting: Family Medicine

## 2021-07-17 ENCOUNTER — Ambulatory Visit (INDEPENDENT_AMBULATORY_CARE_PROVIDER_SITE_OTHER)
Admission: RE | Admit: 2021-07-17 | Discharge: 2021-07-17 | Disposition: A | Discharge status: Home / Self Care | Payer: BC Managed Care – PPO | Source: Ambulatory Visit | Attending: Family Medicine | Admitting: Family Medicine

## 2021-07-17 ENCOUNTER — Other Ambulatory Visit: Payer: Self-pay

## 2021-07-17 ENCOUNTER — Ambulatory Visit (INDEPENDENT_AMBULATORY_CARE_PROVIDER_SITE_OTHER): Payer: BC Managed Care – PPO | Admitting: Family Medicine

## 2021-07-17 VITALS — BP 124/66 | HR 76 | Temp 98.2°F | Resp 17 | Ht 65.0 in | Wt 186.0 lb

## 2021-07-17 DIAGNOSIS — R059 Cough, unspecified: Secondary | ICD-10-CM

## 2021-07-17 DIAGNOSIS — U071 COVID-19: Secondary | ICD-10-CM

## 2021-07-17 DIAGNOSIS — J301 Allergic rhinitis due to pollen: Secondary | ICD-10-CM | POA: Diagnosis not present

## 2021-07-17 DIAGNOSIS — J4540 Moderate persistent asthma, uncomplicated: Secondary | ICD-10-CM

## 2021-07-17 DIAGNOSIS — R0982 Postnasal drip: Secondary | ICD-10-CM

## 2021-07-17 MED ORDER — FLUTICASONE FUROATE-VILANTEROL 200-25 MCG/INH IN AEPB: 1.0000 | INHALATION_SPRAY | Freq: Every day | RESPIRATORY_TRACT | 11 refills | Status: DC

## 2021-07-17 NOTE — Progress Notes (Signed)
Subjective:  Patient ID: Crystal Sims, female    DOB: [birth year]  Age: 40 y.o. MRN: [id number]  CC:  Chief Complaint  Patient presents with   Cough    Pt reports took prednisone notes she is still having hard time getting a full breath has used her inhaler with some relief, pt reports overall better after prednisone     HPI Crystal Sims presents for   Cough Discussed on video visit August 8.  Initial video visit July 20, for COVID-19 infection with initial symptoms started July 16, positive test July 19.  Initially treated with Paxlovid, Tessalon Perles, hydrocodone cough syrup.  Started on Augmentin July 22 with minimal relief.  Video visit July 28 with persistent postnasal drip, cough treated with Tessalon Perles and albuterol.  On my visit August 8 she was using albuterol 2 times per day, some symptoms of feeling like she cannot get a deep breath few times per day.  Postnasal drip at night with scratchy throat.  History of allergies, asthma with plan to follow-up with her specialist.  Started on prednisone for suspected reactive airway/asthma component.  Continued on symptomatic care for her underlying allergic rhinitis, Tessalon Perles, saline nasal spray.  Feels better after prednisone (400mg  QD for 5 days on 8/8) - breathing was better.  Still some cough. Still feeling as not able to take full breath at times. Notices 3-4 times per day. No chest pains. No leg swelling. Sore in muscles to cough only - not there now.  Has been taking her Breo Ellipta QD, singulair for asthma. For PND  - taking nasonex BID, atrovent NS BID, xyzal daily. Saline NS mid day.  No recent mucinex.  Albuterol used daily - 2-3 times per day. Uses when feels like needs to take a deep breath - not wheezing.  Has still been receiving allergy injections, but has not seen allergist No fevers.  First day back at work yesterday.  Appt with ENT on 8/23.    History Patient Active Problem List   Diagnosis Date Noted    Allergic rhinitis due to animal (cat) (dog) hair and dander 11/20/2020   Allergic rhinitis due to pollen 11/20/2020   Chronic allergic conjunctivitis 11/20/2020   Gastro-esophageal reflux disease without esophagitis 11/20/2020   Moderate persistent asthma, uncomplicated 11/20/2020   Vitamin D deficiency 11/20/2020   Obesity (BMI 30-39.9) 07/13/2019   Right foot pain 01/26/2018   Hematuria 11/11/2015   Folliculitis 05/17/2015   Allergic rhinitis 05/17/2015   Neck pain, bilateral posterior 04/18/2015   Routine general medical examination at a health care facility 09/02/2012   Past Medical History:  Diagnosis Date   Allergy    Chicken pox    Environmental allergies    Scoliosis    Seizures (HCC) infancy   Noted only occured once with a fever during infancy   Sinusitis    recurrent   No past surgical history on file. No Known Allergies Prior to Admission medications   Medication Sig Start Date End Date Taking? Authorizing Provider  albuterol (PROAIR HFA) 108 (90 Base) MCG/ACT inhaler Inhale 2 puffs into the lungs every 6 (six) hours as needed for wheezing or shortness of breath. 06/27/21   06/29/21, DO  benzonatate (TESSALON) 200 MG capsule Take 1 capsule (200 mg total) by mouth 2 (two) times daily as needed for cough. 07/08/21   09/07/21, MD  BREO ELLIPTA 100-25 MCG/INH AEPB 1 PUFF ONCE A DAY INHALATION 30 DAYS 07/15/16  [provider]  EPINEPHrine 0.3 mg/0.3 mL IJ SOAJ injection See admin instructions.    [provider]  ipratropium (ATROVENT) 0.03 % nasal spray Place 2 sprays into both nostrils 2 (two) times daily. 01/05/16   Peyton Najjar, MD  IRON PO Take 1 tablet by mouth daily.    [provider]  levocetirizine (XYZAL) 5 MG tablet  03/10/15   [provider]  meloxicam (MOBIC) 15 MG tablet TAKE 1 TABLET BY MOUTH EVERY DAY Patient taking differently: as needed. 09/14/19   Sheliah Hatch, MD  mometasone (NASONEX) 50  MCG/ACT nasal spray PLACE 2 SPRAYS INTO THE NOSE DAILY. 05/17/21   Sheliah Hatch, MD  montelukast (SINGULAIR) 10 MG tablet  03/10/15   [provider]  Multiple Vitamin (MULTIVITAMIN) tablet Take 1 tablet by mouth daily. Reported on 01/05/2016    [provider]  predniSONE (DELTASONE) 20 MG tablet Take 2 tablets (40 mg total) by mouth daily with breakfast. 07/08/21   Shade Flood, MD  spironolactone (ALDACTONE) 100 MG tablet Take 100 mg by mouth daily.    [provider]  tretinoin (RETIN-A) 0.025 % cream Apply topically at bedtime.    [provider]  VITAMIN D PO Take by mouth daily.    [provider]   Social History   Socioeconomic History   Marital status: Single    Spouse name: Not on file   Number of children: Not on file   Years of education: Not on file   Highest education level: Not on file  Occupational History   Not on file  Tobacco Use   Smoking status: Never   Smokeless tobacco: Never  Vaping Use   Vaping Use: Never used  Substance and Sexual Activity   Alcohol use: No   Drug use: No   Sexual activity: Not on file  Other Topics Concern   Not on file  Social History Narrative   Not on file   Social Determinants of Health   Financial Resource Strain: Not on file  Food Insecurity: Not on file  Transportation Needs: Not on file  Physical Activity: Not on file  Stress: Not on file  Social Connections: Not on file  Intimate Partner Violence: Not on file    Review of Systems Per HPI.   Objective:   Vitals:   07/17/21 0906  BP: 124/66  Pulse: 76  Resp: 17  Temp: 98.2 F (36.8 C)  TempSrc: Temporal  SpO2: 97%  Weight: 186 lb (84.4 kg)  Height: 5\' 5"  (1.651 m)     Physical Exam Vitals reviewed.  Constitutional:      Appearance: Normal appearance. She is well-developed.  HENT:     Head: Normocephalic and atraumatic.  Eyes:     Conjunctiva/sclera: Conjunctivae normal.     Pupils: Pupils are  equal, round, and reactive to light.  Neck:     Vascular: No carotid bruit.  Cardiovascular:     Rate and Rhythm: Normal rate and regular rhythm.     Heart sounds: Normal heart sounds.  Pulmonary:     Effort: Pulmonary effort is normal. No respiratory distress.     Breath sounds: Normal breath sounds. No stridor. No wheezing, rhonchi or rales.  Abdominal:     Palpations: Abdomen is soft. There is no pulsatile mass.     Tenderness: There is no abdominal tenderness.  Musculoskeletal:     Right lower leg: No edema.     Left lower leg:  No edema.     Comments: No leg swelling, calves nontender.   Skin:    General: Skin is warm and dry.  Neurological:     Mental Status: She is alert and oriented to person, place, and time.  Psychiatric:        Mood and Affect: Mood normal.        Behavior: Behavior normal.     35 minutes spent during visit, including chart review, counseling and assimilation of information, exam, discussion of plan for cough, plan for specialist follow-up,, and chart completion.    Assessment & Plan:  Crystal Sims is a 40 y.o. female . Cough - Plan: DG Chest 2 View, fluticasone furoate-vilanterol (BREO ELLIPTA) 200-25 MCG/INH AEPB  PND (post-nasal drip)  COVID-19  Allergic rhinitis due to pollen, unspecified seasonality  Moderate persistent asthma, unspecified whether complicated - Plan: DG Chest 2 View, fluticasone furoate-vilanterol (BREO ELLIPTA) 200-25 MCG/INH AEPB  Previous COVID-19 infection with persistent cough, sensation of having to take a deep breath.  Possible asthma component, but lungs clear on exam in office.  No chest pains.  No leg swelling.  No apparent signs of PE, unlikely cardiac cause.  Still component of postnasal drip may be contributing to cough.  Vital signs reassuring, exam reassuring, normal O2 sat.  Check chest x-ray today, try higher dose of Breo as some improvement after prednisone, continue Nasonex and Atrovent nasal spray for  postnasal drip but also recommended follow-up with her allergist.  RTC precautions given.  Additionally has follow-up with ENT in the next week.  Meds ordered this encounter  Medications   fluticasone furoate-vilanterol (BREO ELLIPTA) 200-25 MCG/INH AEPB    Sig: Inhale 1 puff into the lungs daily.    Dispense:  1 each    Refill:  11    Patient Instructions  Chest xray today at [facility].  If any concerns I will let you know.  Continue the nasal sprays for postnasal drip I will increase Breo temporarily for now, but call allergist for appointment. Albuterol if needed for wheezing or shortness of breath.   Let me know how you are doing in the next 1 week.  Return to the clinic or go to the nearest emergency room if any of your symptoms worsen or new symptoms occur.    Signed,   Meredith Staggers, MD [facility], [facility] 07/17/21 9:36 AM

## 2021-07-17 NOTE — Patient Instructions (Addendum)
Chest xray today at [facility].  If any concerns I will let you know.  Continue the nasal sprays for postnasal drip I will increase Breo temporarily for now, but call allergist for appointment. Albuterol if needed for wheezing or shortness of breath.   Let me know how you are doing in the next 1 week.  Return to the clinic or go to the nearest emergency room if any of your symptoms worsen or new symptoms occur.

## 2021-07-18 ENCOUNTER — Other Ambulatory Visit: Payer: Self-pay

## 2021-07-18 ENCOUNTER — Telehealth: Payer: Self-pay

## 2021-07-18 DIAGNOSIS — R0982 Postnasal drip: Secondary | ICD-10-CM

## 2021-07-18 MED ORDER — BENZONATATE 200 MG PO CAPS: 200.0000 mg | ORAL_CAPSULE | Freq: Two times a day (BID) | ORAL | 0 refills | Status: DC | PRN

## 2021-07-18 NOTE — Telephone Encounter (Signed)
Sent to provider for approval

## 2021-07-18 NOTE — Telephone Encounter (Signed)
Encourage patient to contact the pharmacy for refills or they can request refills through [facility]   LAST APPOINTMENT DATE:  07/17/21   NEXT APPOINTMENT DATE:    MEDICATION:benzonatate (TESSALON) 200 MG capsule   PHARMACY: [id number] - [place], [place] - [address]   Let patient know to contact pharmacy at the end of the day to make sure medication is ready.   Please notify patient to allow 48-72 hours to process  LFD 07/08/21 #20 with no refill LOV 07/17/21 NOV none

## 2021-07-18 NOTE — Telephone Encounter (Signed)
  Encourage patient to contact the pharmacy for refills or they can request refills through [facility]  LAST APPOINTMENT DATE:  07/17/21  NEXT APPOINTMENT DATE:   MEDICATION:benzonatate (TESSALON) 200 MG capsule  PHARMACY: [id number] - [place], [place] - [address]  Let patient know to contact pharmacy at the end of the day to make sure medication is ready.  Please notify patient to allow 48-72 hours to process

## 2021-07-20 ENCOUNTER — Other Ambulatory Visit: Payer: Self-pay | Admitting: Family Medicine

## 2021-09-22 IMAGING — DX DG CHEST 2V
2 series · 2 of 2 positions shown · non-contrast
Comparison: 12/12/2014

CLINICAL DATA: Cough for 1 month

EXAM:
CHEST - 2 VIEW

[chest pa]
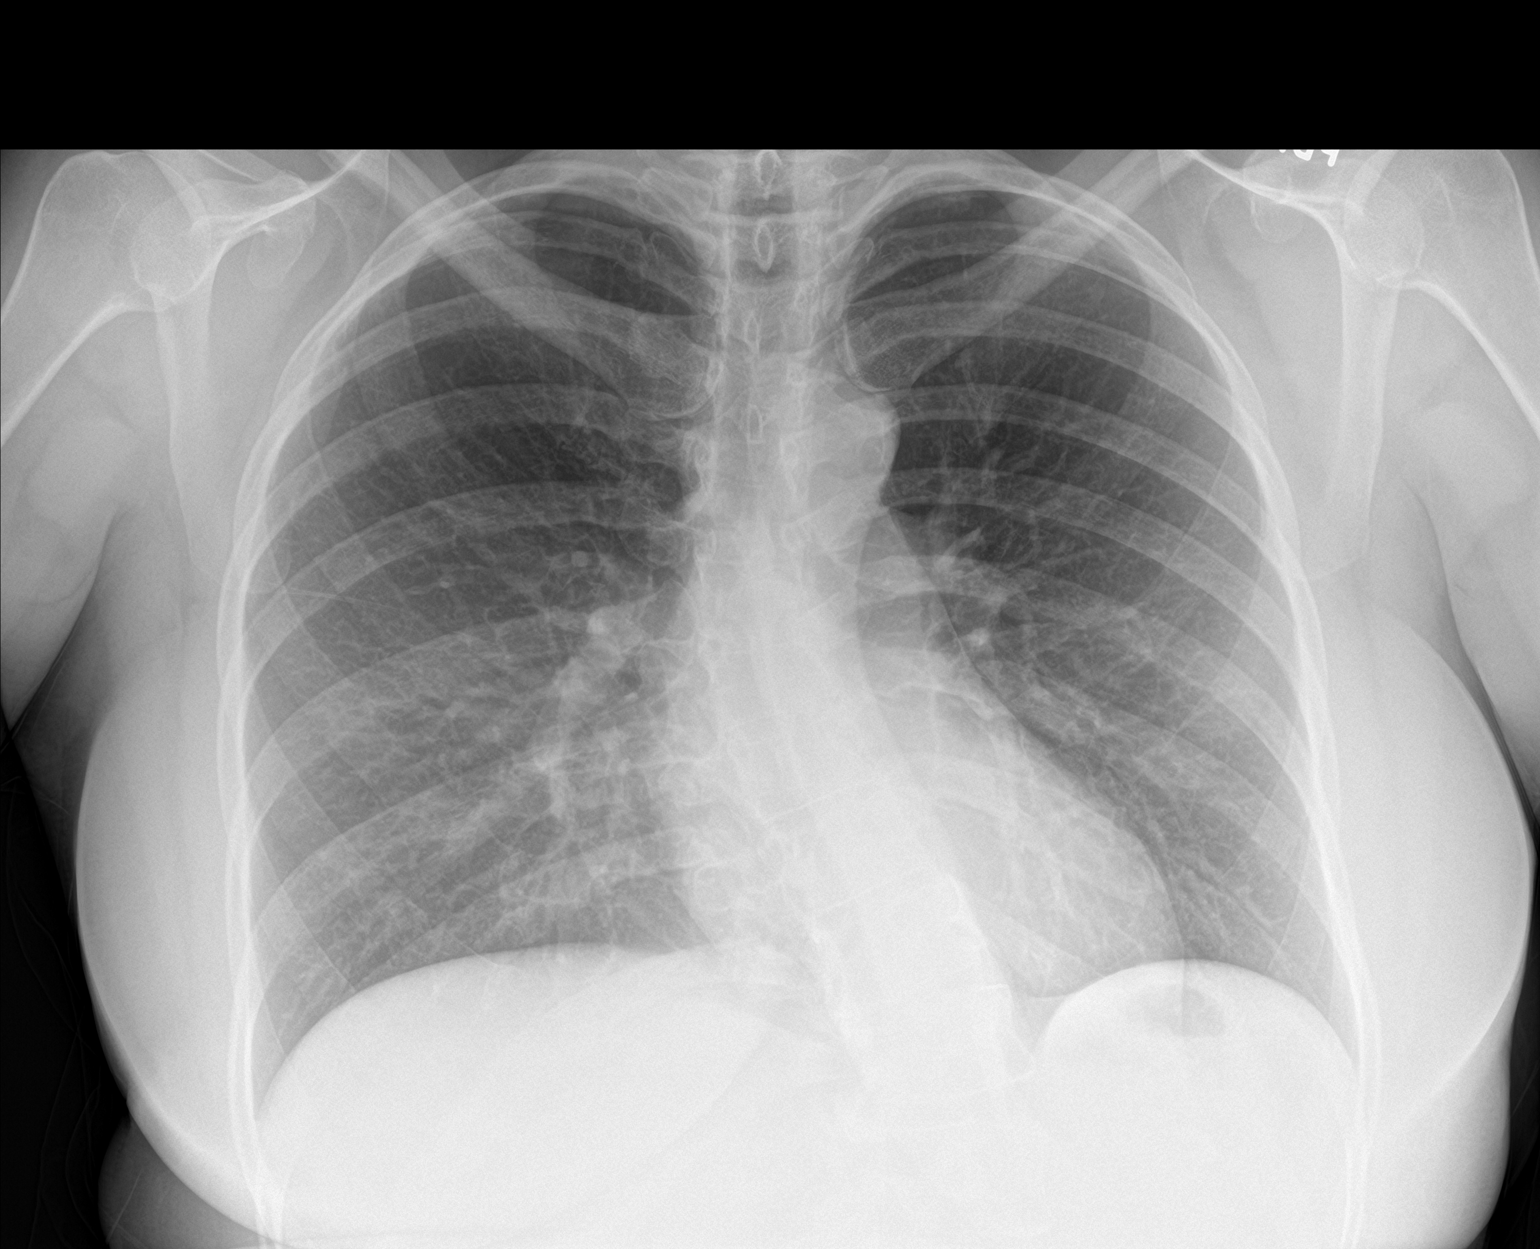

[chest lat]
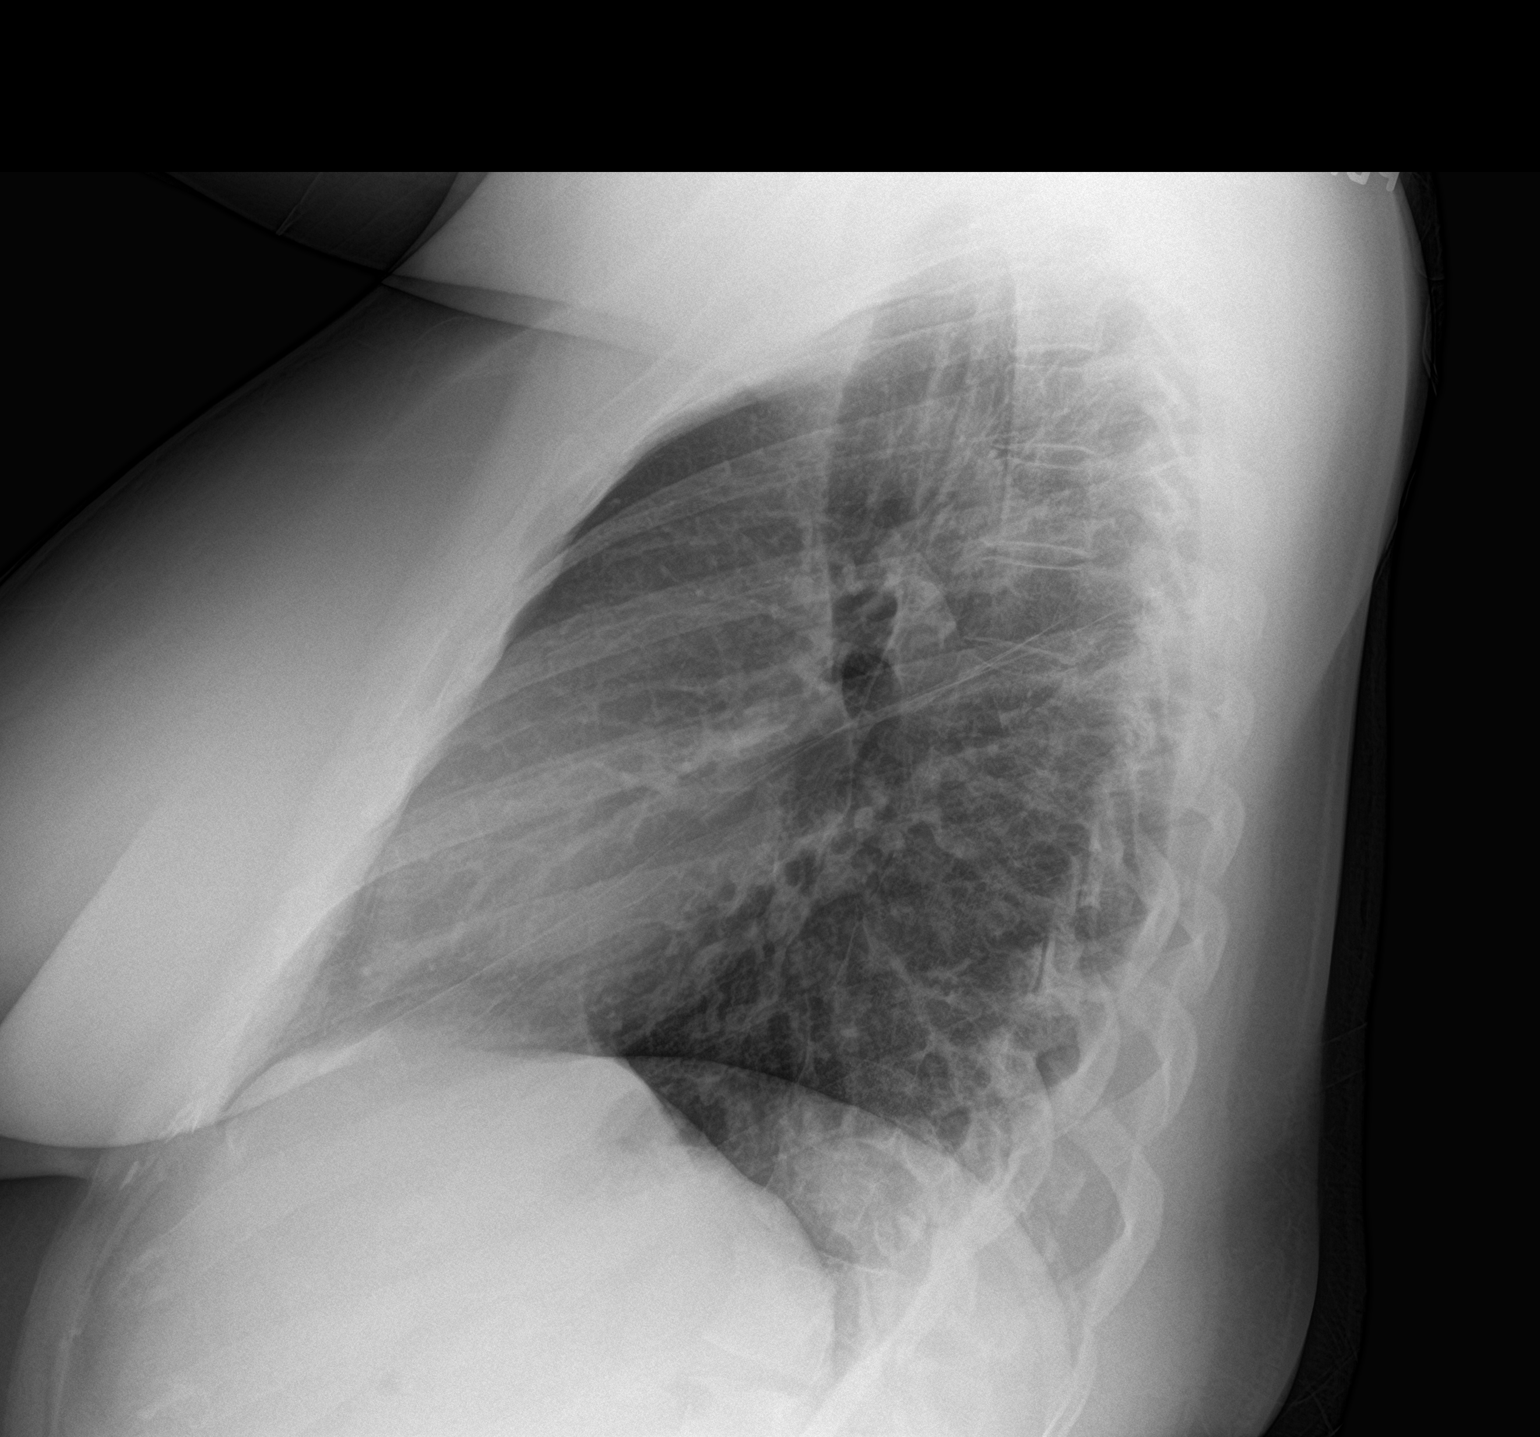

[2 of 2 positions shown; findings below may reference images not displayed]

FINDINGS: The heart size and mediastinal contours are within normal limits. No
focal airspace consolidation, pleural effusion, or pneumothorax.
Thoracolumbar scoliotic curvature.
IMPRESSION: No active cardiopulmonary disease.

## 2021-10-13 ENCOUNTER — Other Ambulatory Visit: Payer: Self-pay | Admitting: Family Medicine

## 2021-11-09 ENCOUNTER — Other Ambulatory Visit: Payer: Self-pay | Admitting: Family Medicine

## 2021-11-22 ENCOUNTER — Encounter: Payer: Self-pay | Admitting: Family Medicine

## 2021-11-22 ENCOUNTER — Ambulatory Visit: Payer: BC Managed Care – PPO | Admitting: Family Medicine

## 2021-11-22 VITALS — BP 118/78 | HR 96 | Temp 97.9°F | Resp 16 | Wt 194.0 lb

## 2021-11-22 DIAGNOSIS — M62838 Other muscle spasm: Secondary | ICD-10-CM

## 2021-11-22 MED ORDER — METHOCARBAMOL 500 MG PO TABS: 500.0000 mg | ORAL_TABLET | Freq: Three times a day (TID) | ORAL | 0 refills | Status: DC | PRN

## 2021-11-22 NOTE — Progress Notes (Signed)
° °  Subjective:    Patient ID: Crystal Sims, female    DOB: 1981/01/05, 40 y.o.   MRN: [id number]  HPI Neck pain- pt reports she will have sxs on both sides.  Starts in neck and travels down into the shoulder.  At times will feel a knot.  Job has her at a computer full time.  Sxs will come and go.  Pt reports she can 'almost tell when it's coming' if there's something stressful at work or while caring for her mom at home.     Review of Systems For ROS see HPI   This visit occurred during the SARS-CoV-2 public health emergency.  Safety protocols were in place, including screening questions prior to the visit, additional usage of staff PPE, and extensive cleaning of exam room while observing appropriate contact time as indicated for disinfecting solutions.      Objective:   Physical Exam Vitals reviewed.  Constitutional:      General: She is not in acute distress.    Appearance: Normal appearance. She is not ill-appearing.  HENT:     Head: Normocephalic and atraumatic.  Musculoskeletal:        General: Tenderness (bilateral trap spasm) present.     Cervical back: Tenderness (TTP over traps bilaterally) present. No rigidity.  Skin:    General: Skin is warm and dry.     Findings: No erythema.  Neurological:     General: No focal deficit present.     Mental Status: She is alert and oriented to person, place, and time.     Cranial Nerves: No cranial nerve deficit.     Motor: No weakness.     Coordination: Coordination normal.     Deep Tendon Reflexes: Reflexes normal.  Psychiatric:        Mood and Affect: Mood normal.        Behavior: Behavior normal.        Thought Content: Thought content normal.          Assessment & Plan:   Bilateral trap spasm- new.  This is cause of pt's neck pain and agree that this is likely stress related.  Encouraged her to be mindful of posture, use a heating pad, schedule a massage, and use NSAIDs as needed.  When that doesn't work, she is to use  Methocarbamol prn.  Pt expressed understanding and is in agreement w/ plan.

## 2021-11-22 NOTE — Patient Instructions (Signed)
Follow up as needed or as scheduled Take Meloxicam/Ibuprofen/Aleve as needed for pain USE the Methocarbamol as needed for tension/spasm HEAT!! Do some gentle neck stretching Try and get regular massages if you can Call with any questions or concerns Stay Safe!  Stay Healthy! Happy Holidays!!!

## 2021-11-26 ENCOUNTER — Encounter: Payer: Self-pay | Admitting: Family Medicine

## 2021-11-26 ENCOUNTER — Ambulatory Visit (INDEPENDENT_AMBULATORY_CARE_PROVIDER_SITE_OTHER): Payer: BC Managed Care – PPO | Admitting: Family Medicine

## 2021-11-26 VITALS — BP 110/80 | HR 90 | Temp 98.1°F | Resp 16 | Ht 65.0 in | Wt 192.4 lb

## 2021-11-26 DIAGNOSIS — J329 Chronic sinusitis, unspecified: Secondary | ICD-10-CM | POA: Diagnosis not present

## 2021-11-26 DIAGNOSIS — E559 Vitamin D deficiency, unspecified: Secondary | ICD-10-CM

## 2021-11-26 DIAGNOSIS — Z0000 Encounter for general adult medical examination without abnormal findings: Secondary | ICD-10-CM | POA: Diagnosis not present

## 2021-11-26 DIAGNOSIS — E669 Obesity, unspecified: Secondary | ICD-10-CM

## 2021-11-26 DIAGNOSIS — B9689 Other specified bacterial agents as the cause of diseases classified elsewhere: Secondary | ICD-10-CM | POA: Diagnosis not present

## 2021-11-26 LAB — BASIC METABOLIC PANEL
BUN: 8 mg/dL (ref 6–23)
CO2: 27 mEq/L (ref 19–32)
Calcium: 9.4 mg/dL (ref 8.4–10.5)
Chloride: 100 mEq/L (ref 96–112)
Creatinine, Ser: 0.65 mg/dL (ref 0.40–1.20)
GFR: 109.69 mL/min (ref >60.00)
Glucose, Bld: 89 mg/dL (ref 70–99)
Potassium: 4.3 mEq/L (ref 3.5–5.1)
Sodium: 137 mEq/L (ref 135–145)

## 2021-11-26 LAB — VITAMIN D 25 HYDROXY (VIT D DEFICIENCY, FRACTURES): VITD: 31.8 ng/mL (ref 30.00–100.00)

## 2021-11-26 LAB — HEPATIC FUNCTION PANEL
ALT: 19 U/L (ref 0–35)
AST: 19 U/L (ref 0–37)
Albumin: 4.2 g/dL (ref 3.5–5.2)
Alkaline Phosphatase: 56 U/L (ref 39–117)
Bilirubin, Direct: 0.1 mg/dL (ref 0.0–0.3)
Total Bilirubin: 0.4 mg/dL (ref 0.2–1.2)
Total Protein: 7.2 g/dL (ref 6.0–8.3)

## 2021-11-26 LAB — CBC WITH DIFFERENTIAL/PLATELET
Basophils Absolute: 0 10*3/uL (ref 0.0–0.1)
Basophils Relative: 0.4 % (ref 0.0–3.0)
Eosinophils Absolute: 0.1 10*3/uL (ref 0.0–0.7)
Eosinophils Relative: 1.2 % (ref 0.0–5.0)
HCT: 38.3 % (ref 36.0–46.0)
Hemoglobin: 12.5 g/dL (ref 12.0–15.0)
Lymphocytes Relative: 25.1 % (ref 12.0–46.0)
Lymphs Abs: 1.1 10*3/uL (ref 0.7–4.0)
MCHC: 32.7 g/dL (ref 30.0–36.0)
MCV: 89.5 fl (ref 78.0–100.0)
Monocytes Absolute: 0.5 10*3/uL (ref 0.1–1.0)
Monocytes Relative: 11.8 % (ref 3.0–12.0)
Neutro Abs: 2.7 10*3/uL (ref 1.4–7.7)
Neutrophils Relative %: 61.5 % (ref 43.0–77.0)
Platelets: 328 10*3/uL (ref 150.0–400.0)
RBC: 4.28 Mil/uL (ref 3.87–5.11)
RDW: 14.1 % (ref 11.5–15.5)
WBC: 4.4 10*3/uL (ref 4.0–10.5)

## 2021-11-26 LAB — LIPID PANEL
Cholesterol: 159 mg/dL (ref 0–200)
HDL: 46.9 mg/dL (ref >39.00)
LDL Cholesterol: 92 mg/dL (ref 0–99)
NonHDL: 112.46
Total CHOL/HDL Ratio: 3
Triglycerides: 100 mg/dL (ref 0.0–149.0)
VLDL: 20 mg/dL (ref 0.0–40.0)

## 2021-11-26 LAB — TSH: TSH: 0.55 u[IU]/mL (ref 0.35–5.50)

## 2021-11-26 MED ORDER — AMOXICILLIN 875 MG PO TABS: 875.0000 mg | ORAL_TABLET | Freq: Two times a day (BID) | ORAL | 0 refills | Status: AC

## 2021-11-26 NOTE — Assessment & Plan Note (Signed)
Pt's PE WNL w/ exception of sinusitis.  UTD on pap, mammo, immunizations.  Check labs.  Anticipatory guidance provided.

## 2021-11-26 NOTE — Assessment & Plan Note (Signed)
Check labs and replete prn. 

## 2021-11-26 NOTE — Patient Instructions (Addendum)
Follow up in 1 year or as needed We'll notify you of your lab results and make any changes if needed Continue to work on healthy diet and regular exercise- you can do it! START the Amoxicillin twice daily- take w/ food Call with any questions or concerns Stay Safe!  Stay Healthy! HAPPY BIRTHDAY!! Happy New Year!!

## 2021-11-26 NOTE — Assessment & Plan Note (Signed)
Pt's BMI is 32.02.  Encouraged healthy diet and regular exercise.  Check labs to risk stratify.  Will follow.

## 2021-11-26 NOTE — Progress Notes (Signed)
Subjective:    Patient ID: Crystal Sims, female    DOB: [birth year], 40 y.o.   MRN: [id number]  HPI CPE- UTD on pap, mammo.  UTD on Tdap, flu, COVID  Patient Care Team    Relationship Specialty Notifications Start End  Midge Minium, MD PCP - General Family Medicine  09/02/12   Mosetta Anis, MD Referring Physician Allergy  10/28/17   Linda Hedges, DO Consulting Physician Obstetrics and Gynecology  07/13/19     Health Maintenance  Topic Date Due   Pneumococcal Vaccine 69-71 Years old (1 - PCV) Never done   TETANUS/TDAP  06/19/2022 (Originally 11/28/1999)   PAP SMEAR-Modifier  11/09/2023   INFLUENZA VACCINE  Completed   COVID-19 Vaccine  Completed   Hepatitis C Screening  Completed   HIV Screening  Completed   HPV VACCINES  Aged Out      Review of Systems Patient reports no vision/ hearing changes, adenopathy,fever, weight change,  persistant/recurrent hoarseness , swallowing issues, chest pain, palpitations, edema, persistant/recurrent cough, hemoptysis, dyspnea (rest/exertional/paroxysmal nocturnal), gastrointestinal bleeding (melena, rectal bleeding), abdominal pain, significant heartburn, bowel changes, GU symptoms (dysuria, hematuria, incontinence), Gyn symptoms (abnormal  bleeding, pain),  syncope, focal weakness, memory loss, numbness & tingling, skin/hair/nail changes, abnormal bruising or bleeding, anxiety, or depression.   URI- increased nasal congestion and drainage, facial pain/pressure, HA, developed a cough yesterday.  Nasal drainage is clear.  No body aches.  No fevers.  Home COVID (-).  No tooth pain, some R ear fullness.  Sxs have been worsening over the last 7-10 days.  This visit occurred during the SARS-CoV-2 public health emergency.  Safety protocols were in place, including screening questions prior to the visit, additional usage of staff PPE, and extensive cleaning of exam room while observing appropriate contact time as indicated for disinfecting  solutions.      Objective:   Physical Exam General Appearance:    Alert, cooperative, no distress, appears stated age  Head:    Normocephalic, without obvious abnormality, atraumatic  Eyes:    PERRL, conjunctiva/corneas clear, EOM's intact, fundi    benign, both eyes  Ears:    Normal TM's and external ear canals, both ears  Nose:   TTP over frontal and maxillary sinuses  Throat:   Neck:   Supple, symmetrical, trachea midline, no adenopathy;    Thyroid: no enlargement/tenderness/nodules  Back:     Symmetric, no curvature, ROM normal, no CVA tenderness  Lungs:     Clear to auscultation bilaterally, respirations unlabored  Chest Wall:    No tenderness or deformity   Heart:    Regular rate and rhythm, S1 and S2 normal, no murmur, rub   or gallop  Breast Exam:    Deferred to GYN  Abdomen:     Soft, non-tender, bowel sounds active all four quadrants,    no masses, no organomegaly  Genitalia:    Deferred to GYN  Rectal:    Extremities:   Extremities normal, atraumatic, no cyanosis or edema  Pulses:   2+ and symmetric all extremities  Skin:   Skin color, texture, turgor normal, no rashes or lesions  Lymph nodes:   Cervical, supraclavicular, and axillary nodes normal  Neurologic:   CNII-XII intact, normal strength, sensation and reflexes    throughout          Assessment & Plan:  Bacterial sinusitis- sxs have been worsening over last 7-10 days.  Now w/ facial pain/pressure.  Start Amoxicillin.  Reviewed supportive care and red  flags that should prompt return.  Pt expressed understanding and is in agreement w/ plan.

## 2021-12-04 ENCOUNTER — Encounter: Payer: Self-pay | Admitting: *Deleted

## 2021-12-06 ENCOUNTER — Telehealth: Payer: Self-pay

## 2021-12-06 NOTE — Telephone Encounter (Signed)
Caller name:Melodye Christella Noa   On DPR? :Yes  Call back [phone]  Provider they see: Beverely Low    Reason for call:Pt come in to see Dr Beverely Low last week she is having cough and congestion pt was placed on amoxicillin (AMOXIL) 875 MG tablet  pt has one more pill of that. Congestion is improving but still have drainage and dry cough still is there something else you can recommend that she take or do/ She called her ENT and they have advised there is nothing that they can do.

## 2021-12-09 NOTE — Telephone Encounter (Signed)
LVM for patient letting her know what Dr Beverely Low advised. I asked that she give [place] a call back with any questions or updates

## 2021-12-09 NOTE — Telephone Encounter (Signed)
Unfortunately at this point, things need to run their course.  Continue daily allergy medication (pill and nasal spray) and you can use Dayquil/Nyquil as needed for cough/congestion.  Lots of fluids, rest when needed, and this should turn the corner soon

## 2021-12-14 ENCOUNTER — Other Ambulatory Visit: Payer: Self-pay | Admitting: Family Medicine

## 2022-02-20 ENCOUNTER — Other Ambulatory Visit: Payer: Self-pay

## 2022-02-20 ENCOUNTER — Ambulatory Visit
Admission: EM | Admit: 2022-02-20 | Discharge: 2022-02-20 | Disposition: A | Discharge status: Home / Self Care | Payer: BC Managed Care – PPO | Attending: Physician Assistant | Admitting: Physician Assistant

## 2022-02-20 DIAGNOSIS — M542 Cervicalgia: Secondary | ICD-10-CM | POA: Diagnosis not present

## 2022-02-20 MED ORDER — CYCLOBENZAPRINE HCL 10 MG PO TABS: 10.0000 mg | ORAL_TABLET | Freq: Two times a day (BID) | ORAL | 0 refills | Status: DC | PRN

## 2022-02-20 MED ORDER — PREDNISONE 20 MG PO TABS: 40.0000 mg | ORAL_TABLET | Freq: Every day | ORAL | 0 refills | Status: AC

## 2022-02-20 NOTE — ED Triage Notes (Signed)
Onset 2 days ago with neck pain and left shoulder tightness. Pt reports no falls or injury to her neck.  ?

## 2022-02-20 NOTE — ED Provider Notes (Signed)
?[facility] ? ? ? ?CSN: [id number] ?Arrival date & time: 02/20/22  0827 ? ? ?  ? ?History   ?Chief Complaint ?No chief complaint on file. ? ? ?HPI ?Crystal Sims is a 41 y.o. female.  ? ?Patient here today for evaluation of neck pain that has been waxing and waning for some time but became more severe yesterday. Pain is present to left side and it is worsened with movement. She has not had any numbness, tingling or weakness. She has tried OTC meds with minimal relief. She has also tried muscle relaxer previously prescribed but states it was not helpful. She denies any injury or falls.  ? ?The history is provided by the patient.  ? ?Past Medical History:  ?Diagnosis Date  ? Allergy   ? Chicken pox   ? Environmental allergies   ? Scoliosis   ? Seizures (HCC) infancy  ? Noted only occured once with a fever during infancy  ? Sinusitis   ? recurrent  ? ? ?Patient Active Problem List  ? Diagnosis Date Noted  ? Allergic rhinitis due to animal (cat) (dog) hair and dander 11/20/2020  ? Allergic rhinitis due to pollen 11/20/2020  ? Chronic allergic conjunctivitis 11/20/2020  ? Gastro-esophageal reflux disease without esophagitis 11/20/2020  ? Moderate persistent asthma, uncomplicated 11/20/2020  ? Vitamin D deficiency 11/20/2020  ? Obesity (BMI 30-39.9) 07/13/2019  ? Right foot pain 01/26/2018  ? Hematuria 11/11/2015  ? Folliculitis 05/17/2015  ? Allergic rhinitis 05/17/2015  ? Neck pain, bilateral posterior 04/18/2015  ? Routine general medical examination at a health care facility 09/02/2012  ? ? ?No past surgical history on file. ? ?OB History   ?No obstetric history on file. ?  ? ? ? ?Home Medications   ? ?Prior to Admission medications   ?Medication Sig Start Date End Date Taking? Authorizing Provider  ?cyclobenzaprine (FLEXERIL) 10 MG tablet Take 1 tablet (10 mg total) by mouth 2 (two) times daily as needed for muscle spasms. 02/20/22  Yes Tomi Bamberger, PA-C  ?predniSONE (DELTASONE) 20 MG tablet Take 2  tablets (40 mg total) by mouth daily with breakfast for 5 days. 02/20/22 02/25/22 Yes Tomi Bamberger, PA-C  ?albuterol [facility] HFA) 108 (90 Base) MCG/ACT inhaler Inhale 2 puffs into the lungs every 6 (six) hours as needed for wheezing or shortness of breath. 06/27/21   Kriste Basque R, DO  ?EPINEPHrine 0.3 mg/0.3 mL IJ SOAJ injection See admin instructions.    [provider]  ?fluticasone furoate-vilanterol (BREO ELLIPTA) 200-25 MCG/INH AEPB Inhale 1 puff into the lungs daily. 07/17/21   Shade Flood, MD  ?ipratropium (ATROVENT) 0.03 % nasal spray Place 2 sprays into both nostrils 2 (two) times daily. 01/05/16   Peyton Najjar, MD  ?IRON PO Take 1 tablet by mouth daily.    [provider]  ?levocetirizine (XYZAL) 5 MG tablet  03/10/15   [provider]  ?meloxicam (MOBIC) 15 MG tablet TAKE 1 TABLET BY MOUTH EVERY DAY ?Patient taking differently: as needed. 09/14/19   Sheliah Hatch, MD  ?mometasone (NASONEX) 50 MCG/ACT nasal spray PLACE 2 SPRAYS INTO THE NOSE DAILY. 12/16/21   Sheliah Hatch, MD  ?montelukast (SINGULAIR) 10 MG tablet  03/10/15   [provider]  ?Multiple Vitamin (MULTIVITAMIN) tablet Take 1 tablet by mouth daily. Reported on 01/05/2016    [provider]  ?spironolactone (ALDACTONE) 100 MG tablet Take 100 mg by mouth daily.    [provider]  ?  tretinoin (RETIN-A) 0.025 % cream Apply topically at bedtime.    [provider]  ?VITAMIN D PO Take by mouth daily.    [provider]  ? ? ?Family History ?Family History  ?Problem Relation Age of Onset  ? Hypertension Mother   ? Diabetes Mother   ? Hypertension Father   ? Kidney disease Father   ? Diabetes Father   ? Hypertension Maternal Grandmother   ? Diabetes Maternal Grandmother   ? Cancer Maternal Grandfather   ? Stroke Maternal Grandfather   ? Hypertension Maternal Grandfather   ? Diabetes Maternal Grandfather   ? Arthritis Paternal Grandmother   ? Hypertension Paternal  Grandmother   ? Diabetes Paternal Grandmother   ? Hypertension Paternal Grandfather   ? Diabetes Paternal Grandfather   ? ? ?Social History ?Social History  ? ?Tobacco Use  ? Smoking status: Never  ? Smokeless tobacco: Never  ?Vaping Use  ? Vaping Use: Never used  ?Substance Use Topics  ? Alcohol use: No  ? Drug use: No  ? ? ? ?Allergies   ?Patient has no known allergies. ? ? ?Review of Systems ?Review of Systems  ?Constitutional:  Negative for chills and fever.  ?Eyes:  Negative for discharge and redness.  ?Gastrointestinal:  Negative for abdominal pain, nausea and vomiting.  ?Musculoskeletal:  Positive for myalgias, neck pain and neck stiffness.  ?Neurological:  Negative for weakness and numbness.  ? ? ?Physical Exam ?Triage Vital Signs ?ED Triage Vitals  ?Enc Vitals Group  ?   BP   ?   Pulse   ?   Resp   ?   Temp   ?   Temp src   ?   SpO2   ?   Weight   ?   Height   ?   Head Circumference   ?   Peak Flow   ?   Pain Score   ?   Pain Loc   ?   Pain Edu?   ?   Excl. in GC?   ? ?No data found. ? ?Updated Vital Signs ?BP 132/83 (BP Location: Left Arm)   Pulse 82   Temp 97.8 ?F (36.6 ?C)   Resp 16   LMP 02/07/2022 (Approximate)   SpO2 100%  ?   ? ?Physical Exam ?Vitals and nursing note reviewed.  ?Constitutional:   ?   General: She is not in acute distress. ?   Appearance: Normal appearance. She is not ill-appearing.  ?HENT:  ?   Head: Normocephalic and atraumatic.  ?Eyes:  ?   Conjunctiva/sclera: Conjunctivae normal.  ?Cardiovascular:  ?   Rate and Rhythm: Normal rate.  ?Pulmonary:  ?   Effort: Pulmonary effort is normal.  ?Musculoskeletal:  ?   Comments: No TTP to midline C-Spine, mild TTP and tightness noted to left side trapezius, decreased ROM of neck due to pain  ?Neurological:  ?   Mental Status: She is alert.  ?Psychiatric:     ?   Mood and Affect: Mood normal.     ?   Behavior: Behavior normal.     ?   Thought Content: Thought content normal.  ? ? ? ?UC Treatments / Results  ?Labs ?(all labs ordered are  listed, but only abnormal results are displayed) ?Labs Reviewed - No data to display ? ?EKG ? ? ?Radiology ?No results found. ? ?Procedures ?Procedures (including critical care time) ? ?Medications Ordered in UC ?Medications - No data to display ? ?Initial Impression /  Assessment and Plan / UC Course  ?I have reviewed the triage vital signs and the nursing notes. ? ?Pertinent labs & imaging results that were available during my care of the patient were reviewed by me and considered in my medical decision making (see chart for details). ? ?  ?Suspect muscle spasm and recommended an alternate muscle relaxer as well as steroids to hopefully help relieve pain.  Recommended massage and heat as well.  Encouraged follow-up if no improvement or if symptoms worsen. ? ?Final Clinical Impressions(s) / UC Diagnoses  ? ?Final diagnoses:  ?Neck pain  ? ? ? ?Discharge Instructions   ? ?  ? ?Try heat and massage for neck pain as well.  ? ?Do not take methocarbamol and cyclobenzaprine together.  ? ? ? ? ? ? ?ED Prescriptions   ? ? Medication Sig Dispense Auth. Provider  ? predniSONE (DELTASONE) 20 MG tablet Take 2 tablets (40 mg total) by mouth daily with breakfast for 5 days. 10 tablet Tomi BambergerMyers, Jonaya Freshour F, PA-C  ? cyclobenzaprine (FLEXERIL) 10 MG tablet Take 1 tablet (10 mg total) by mouth 2 (two) times daily as needed for muscle spasms. 20 tablet Tomi BambergerMyers, Bhavin Monjaraz F, PA-C  ? ?  ? ?PDMP not reviewed this encounter. ?  ?Tomi BambergerMyers, Kaye Mitro F, PA-C ?02/20/22 [phone] ? ?

## 2022-02-20 NOTE — Discharge Instructions (Signed)
?  Try heat and massage for neck pain as well.  ? ?Do not take methocarbamol and cyclobenzaprine together.  ? ? ?

## 2022-02-21 ENCOUNTER — Ambulatory Visit (INDEPENDENT_AMBULATORY_CARE_PROVIDER_SITE_OTHER): Payer: BC Managed Care – PPO | Admitting: Family Medicine

## 2022-02-21 VITALS — BP 148/78 | HR 101 | Temp 98.4°F | Ht 65.0 in | Wt 195.8 lb

## 2022-02-21 DIAGNOSIS — M542 Cervicalgia: Secondary | ICD-10-CM

## 2022-02-21 MED ORDER — KETOROLAC TROMETHAMINE 60 MG/2ML IM SOLN
60.0000 mg | Freq: Once | INTRAMUSCULAR | Status: AC
  Administered 2022-02-21: 60 mg via INTRAMUSCULAR

## 2022-02-21 MED ORDER — METHYLPREDNISOLONE ACETATE 80 MG/ML IJ SUSP
80.0000 mg | Freq: Once | INTRAMUSCULAR | Status: AC
  Administered 2022-02-21: 80 mg via INTRAMUSCULAR

## 2022-02-21 NOTE — Patient Instructions (Signed)
It was very nice to see you today! ? ?We will give you an injection of 2 anti-inflammatories today.  Please continue the Flexeril and prednisone. ? ?Work on the exercises.  I will refer you to see a physical therapist. ? ?Take care, ?Dr Jerline Pain ? ?PLEASE NOTE: ? ?If you had any lab tests please let us know if you have not heard back within a few days. You may see your results on mychart before we have a chance to review them but we will give you a call once they are reviewed by [place]. If we ordered any referrals today, please let us know if you have not heard from their office within the next week.  ? ?Please try these tips to maintain a healthy lifestyle: ? ?Eat at least 3 REAL meals and 1-2 snacks per day.  Aim for no more than 5 hours between eating.  If you eat breakfast, please do so within one hour of getting up.  ? ?Each meal should contain half fruits/vegetables, one quarter protein, and one quarter carbs (no bigger than a computer mouse) ? ?Cut down on sweet beverages. This includes juice, soda, and sweet tea.  ? ?Drink at least 1 glass of water with each meal and aim for at least 8 glasses per day ? ?Exercise at least 150 minutes every week.   ?

## 2022-02-21 NOTE — Progress Notes (Signed)
? ?  Crystal Sims is a 41 y.o. female who presents today for an office visit. ? ?Assessment/Plan:  ?Neck Pain ?Consistent with trapezius strain.  She has not had much relief with prednisone or Flexeril as of yet.  We discussed treatment options.  We will give IM injection of 60 mg of Toradol and 80 mg of Depo-Medrol today.  She will continue taking her Flexeril.  We also discussed trigger point injection however deferred for now.  Discussed home exercise program and handout was given.  We will refer to PT per patient request.  We discussed reasons to return to care and seek emergent care.  Follow-up as needed. ? ?  ?Subjective:  ?HPI: ? ?Patient here with flare up of neck pain. She has intermittent issues with this for several years. Typically takes a muscle relaxer which helps.Most recently got worse within the last 3 days. Went to urgent care yesterday and was given a prescription for a different muscle relaxer and prednisone. Symptoms seems to be worsening today. No obvious precipitating events. Worse with lifting and twisting.  No reported weakness or numbness. ? ?   ?  ?Objective:  ?Physical Exam: ?BP (!) 148/78 (BP Location: Right Arm)   Pulse (!) 101   Temp 98.4 ?F (36.9 ?C) (Temporal)   Ht 5\' 5"  (1.651 m)   Wt 195 lb 12.8 oz (88.8 kg)   LMP 02/07/2022 (Approximate)   SpO2 97%   BMI 32.58 kg/m?   ?Gen: No acute distress, resting comfortably ?MSK: ?- Neck: No deformities.  Tenderness to palpation along trapezius and left cervical paraspinal muscles.  Neurovascular intact distally. ?Neuro: Grossly normal, moves all extremities ?Psych: Normal affect and thought content ? ?   ? ?04/09/2022. Katina Degree, MD ?02/21/2022 3:10 PM  ?

## 2022-02-21 NOTE — Addendum Note (Signed)
Addended by: Royann Shivers on: 02/21/2022 03:39 PM ? ? Modules accepted: Orders ? ?

## 2022-02-24 ENCOUNTER — Ambulatory Visit: Payer: BC Managed Care – PPO | Admitting: Family Medicine

## 2022-02-24 ENCOUNTER — Encounter: Payer: Self-pay | Admitting: Family Medicine

## 2022-02-24 VITALS — BP 138/86 | HR 117 | Temp 98.2°F | Ht 65.0 in | Wt 200.8 lb

## 2022-02-24 DIAGNOSIS — F411 Generalized anxiety disorder: Secondary | ICD-10-CM

## 2022-02-24 DIAGNOSIS — M542 Cervicalgia: Secondary | ICD-10-CM

## 2022-02-24 MED ORDER — ESCITALOPRAM OXALATE 10 MG PO TABS: 10.0000 mg | ORAL_TABLET | Freq: Every day | ORAL | 3 refills | Status: DC

## 2022-02-24 MED ORDER — MELOXICAM 15 MG PO TABS: 15.0000 mg | ORAL_TABLET | Freq: Every day | ORAL | 0 refills | Status: DC

## 2022-02-24 NOTE — Therapy (Signed)
?OUTPATIENT PHYSICAL THERAPY CERVICAL EVALUATION ? ? ?Patient Name: Crystal Sims ?MRN: [id number] ?DOB:02/08/1981, 41 y.o., female ?Today's Date: 02/25/2022 ? ? PT End of Session - 02/25/22 1515   ? ? Visit Number 1   ? Number of Visits 8   ? Date for PT Re-Evaluation 04/22/22   ? Authorization Type [employer]   ? PT Start Time 1516   ? PT Stop Time 1554   ? PT Time Calculation (min) 38 min   ? Activity Tolerance Patient tolerated treatment well   ? ?  ?  ? ?  ? ? ?Past Medical History:  ?Diagnosis Date  ? Allergy   ? Chicken pox   ? Environmental allergies   ? Scoliosis   ? Seizures (HCC) infancy  ? Noted only occured once with a fever during infancy  ? Sinusitis   ? recurrent  ? ?History reviewed. No pertinent surgical history. ?Patient Active Problem List  ? Diagnosis Date Noted  ? Allergic rhinitis due to animal (cat) (dog) hair and dander 11/20/2020  ? Allergic rhinitis due to pollen 11/20/2020  ? Chronic allergic conjunctivitis 11/20/2020  ? Gastro-esophageal reflux disease without esophagitis 11/20/2020  ? Moderate persistent asthma, uncomplicated 11/20/2020  ? Vitamin D deficiency 11/20/2020  ? Obesity (BMI 30-39.9) 07/13/2019  ? Right foot pain 01/26/2018  ? Hematuria 11/11/2015  ? Folliculitis 05/17/2015  ? Allergic rhinitis 05/17/2015  ? Neck pain, bilateral posterior 04/18/2015  ? Routine general medical examination at a health care facility 09/02/2012  ? ? ?PCP: Sheliah Hatch, MD ? ?REFERRING PROVIDER: Ardith Dark, MD ? ? ?REFERRING DIAG: M54.2 (ICD-10-CM) - Neck pain ? ?THERAPY DIAG:  ?Cervicalgia ? ?Neck pain ? ?Muscle weakness (generalized) ? ?ONSET DATE: 02/18/22  ? ?SUBJECTIVE:                                                                                                                                                                                                        ? ?SUBJECTIVE STATEMENT: ?States she has been having tightness in her neck on both sides. States she can also feel a lump on  the side of her neck. States that she has had it before but it usually goes away. States that this time her neck pain was worse and was painful with walking, sleeping and eating. States her pain was bad that she went to the urgent care but it started on the 21st and it woke her up. Denies numbness or tingling. States that she feels stress makes it worse. Reports turning/checking her blind spots were difficulty and  painful. States that she is working longer. States she used to walk and is no longer able to because her longer work days. ? ?PERTINENT HISTORY:  ?scoliosis ? ?PAIN:  ?Are you having pain? Yes: NPRS scale: 3/10 ?Pain location: bilateral shoulders/neck ?Pain description: stiffness ?Aggravating factors: movement ?Relieving factors: reducing stress ? ?PRECAUTIONS: None ? ?WEIGHT BEARING RESTRICTIONS No ? ?FALLS:  ?Has patient fallen in last 6 months? No ? ?LIVING ENVIRONMENT: ?Lives in: House/apartment ?Stairs: Yes: External: 12 steps; railings on both sides ? ? 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 ?PLOF: Independent ? ?PATIENT GOALS alleviate pain  ? ?OBJECTIVE:  ? ?DIAGNOSTIC FINDINGS:  ?No recent imaging ? ? ? ?COGNITION: ?Overall cognitive status: Within functional limits for tasks assessed ? ? ?SENSATION: ?WFL ? ?POSTURE:  ?Forward shoulders, forward head ? ?PALPATION: ?Increased resting tone throughout upper cervical musculature. Pain with palpation inc ervical paraspinals. Hypomobility noted throughout cervical spine  ? ?CERVICAL ROM:  ? ?Active ROM A/PROM (deg) ?02/25/2022  ?Flexion 40  ?Extension 35  ?Right lateral flexion   ?Left lateral flexion   ?Right rotation 30*  ?Left rotation 20  ? (Blank rows = not tested) ?*pain on ipsilateral side of neck ? ? ? UE Measurements ?Upper Extremity Right ?02/25/2022 Left ?02/25/2022  ? A/PROM MMT A/PROM MMT  ?Shoulder Flexion WNL 5 WNL 5  ?Shoulder Extension      ?Shoulder Abduction WNL 5 WNL 5  ?Shoulder Adduction      ?Shoulder Internal  Rotation WNL 4+ WNL 4+  ?Shoulder External Rotation WNL 4+ WNL 4+  ?Elbow Flexion      ?Elbow Extension      ?Wrist Flexion      ?Wrist Extension      ?Wrist Supination      ?Wrist Pronation      ?Wrist Ulnar Deviation      ?Wrist Radial Deviation      ?Grip Strength NA  NA   ?  (Blank rows = not tested) ?  * pain ? ? ?CERVICAL SPECIAL TESTS:  ?Spurling's test: Negative and Distraction test: Positive ? ? ? ?TODAY'S TREATMENT:  ?02/25/2022 ?Therapeutic Exercise: ? Aerobic: ?Supine:cervical ROT 2 minutes  ?Prone: ? Seated: ? Standing: self mobilization with tennis ball at wall 2 minutes ?Neuromuscular Re-education: ?Manual Therapy: ?Therapeutic Activity: ?Self Care: ?Trigger Point Dry Needling:  ?Modalities:  ? ? ? ?PATIENT EDUCATION:  ?Education details: on current presentation, on HEP, on rationale for exercises and ROM and POC, on benefits of self traction device ?Person educated: Patient ?Education method: Explanation, Demonstration, and Handouts ?Education comprehension: verbalized understanding ? ? ?HOME EXERCISE PROGRAM: ?[id number] ? ?ASSESSMENT: ? ?CLINICAL IMPRESSION: ?Patient is a 41 y.o. female who was seen today for physical therapy evaluation and treatment for bilateral neck pain. Patient with history of episodic neck pain with most recent episode being more intense and longer in duration. Patient presents with limitations in neck ROM, strength and posture that are limiting overall function. Patient would benefit from skilled PT to improve overall mobility and reduce incidence of neck pain.  ? ? ?OBJECTIVE IMPAIRMENTS decreased activity tolerance, decreased ROM, decreased strength, increased muscle spasms, postural dysfunction, and pain.  ? ?ACTIVITY LIMITATIONS community activity, driving, meal prep, and occupation.  ? ?PERSONAL FACTORS Age and 1 comorbidity: Chronic neck pain  are also affecting patient's functional outcome.  ? ? ?REHAB POTENTIAL: Good ? ?CLINICAL DECISION MAKING:  Stable/uncomplicated ? ?EVALUATION COMPLEXITY: Low ? ? ?GOALS: ?Goals reviewed with patient?  yes ? ?SHORT TERM  GOALS: ? ?Patient will be independent in self management strategies to improve quality of life and functional outcomes. ?Baseline: new program ?Target date: 03/25/2022 ?Goal status: INITIAL ? ?2.  Patient will report at least 50% improvement in overall symptoms and/or function to demonstrate improved functional mobility ?Baseline: 0% ?Target date: 03/25/2022 ?Goal status: INITIAL ? ?3.  Patient will be able to demonstrate at least 45 degrees of cervical ROT bilaterally to improve ability to check blind spots while driving. ?Baseline: see above ?Target date: 03/25/2022 ?Goal status: INITIAL ? ? ? ? ?LONG TERM GOALS: ? ?Patient will report at least 75% improvement in overall symptoms and/or function to demonstrate improved functional mobility ?Baseline: 0% ?Target date: 04/22/2022 ?Goal status: INITIAL ? ?2.   Patient will be able to demonstrate at least 60 degrees of cervical ROT bilaterally to improve ability to check blind spots while driving. ?Baseline: see above ?Target date: 04/22/2022 ?Goal status: INITIAL ? ?3.  Patient will report performing neck mobility exercises daily to improve overall mobility and reduce incidence of neck pain. ?Baseline: none currently ?Target date: 04/22/2022 ?Goal status: INITIAL ? ? ? ?PLAN: ?PT FREQUENCY: 1x/week ? ?PT DURATION: 8 weeks ? ?PLANNED INTERVENTIONS: Therapeutic exercises, Therapeutic activity, Neuromuscular re-education, Balance training, Gait training, Patient/Family education, Joint manipulation, Joint mobilization, Dry Needling, Electrical stimulation, Spinal manipulation, Moist heat, Ionotophoresis 4mg /ml Dexamethasone, and Manual therapy ? ?PLAN FOR NEXT SESSION: cervical traction, self occipital release, chin tuck, f/u with traction device ? ? ?4:03 PM, 02/25/22 ?Tereasa CoopMichele Deaven Urwin, DPT ?Physical Therapy with [place] ?[facility]  ?[phone]  office ? ? ? ? ?  ?

## 2022-02-24 NOTE — Patient Instructions (Signed)
It was very nice to see you today! ? ?I will refill your meloxicam. ? ?Your stress could be contributing.  Please start the Lexapro.  Send a message in a few weeks to let me know how you are doing with this. ? ?Take care, ?Dr Jerline Pain ? ?PLEASE NOTE: ? ?If you had any lab tests please let us know if you have not heard back within a few days. You may see your results on mychart before we have a chance to review them but we will give you a call once they are reviewed by [place]. If we ordered any referrals today, please let us know if you have not heard from their office within the next week.  ? ?Please try these tips to maintain a healthy lifestyle: ? ?Eat at least 3 REAL meals and 1-2 snacks per day.  Aim for no more than 5 hours between eating.  If you eat breakfast, please do so within one hour of getting up.  ? ?Each meal should contain half fruits/vegetables, one quarter protein, and one quarter carbs (no bigger than a computer mouse) ? ?Cut down on sweet beverages. This includes juice, soda, and sweet tea.  ? ?Drink at least 1 glass of water with each meal and aim for at least 8 glasses per day ? ?Exercise at least 150 minutes every week.   ?

## 2022-02-24 NOTE — Progress Notes (Signed)
? ?  Crystal Sims is a 41 y.o. female who presents today for an office visit. ? ?Assessment/Plan:  ?Chronic Problems Addressed Today: ?Neck Pain ?Seems to be improving.  Do not think she would benefit much from trigger point injection as she has no identifiable trigger points today.  She will be following up with physical therapy tomorrow.  We will refill her meloxicam-she has done well with this in the past.  It continues to be an issue despite PT will need referral to sports medicine ? ?Stress / anxiety ?Likely contributing to her above chronic back pain.  Her GAD is modestly elevated to 5 today.  We discussed treatment options including medications versus therapy.  She is interested in starting medication.  We will start Lexapro 10 mg daily.  Discussed potential side effects.  She will follow-up with me in a few weeks via MyChart though advised her to follow-up with her PCP soon for ongoing management.  She was agreeable to this.  We discussed reasons to return to care. ? ?  ?Subjective:  ?HPI: ? ?Patient here for neck pain follow-up.  We saw her 3 days ago for trapezius strain.  She was given injection of Toradol and Depo-Medrol.  She was continued on Flexeril.  Her symptoms have improved though she still has quite a bit of pain in her bilateral neck.  This is worse with head rotation.  We discussed home exercise program and handout was given under last visit.  We also referred her to see physical therapy.  She will start with them tomorrow.  She is wondering about possible trigger point injection. ? ?She has been under a lot of stress recently.  She thinks this could be contributing to her neck pain.  She has been anxious and stressed most of her life.  She works as a Clinical biochemist.  She has never been on medications to help with stress.  She has never seen a psychologist. ? ?   ?  ?Objective:  ?Physical Exam: ?BP 138/86 (BP Location: Right Arm)   Pulse (!) 117   Temp 98.2 ?F (36.8 ?C) (Temporal)   Ht 5'  5" (1.651 m)   Wt 200 lb 12.8 oz (91.1 kg)   LMP 02/07/2022 (Approximate)   SpO2 98%   BMI 33.41 kg/m?   ?Gen: No acute distress, resting comfortably ?CV: Regular rate and rhythm with no murmurs appreciated ?Pulm: Normal work of breathing, clear to auscultation bilaterally with no crackles, wheezes, or rhonchi ?MSK: Bilateral neck without deformities.  Tenderness with leftward and rightward rotation.  No identifiable trigger points noted.  Neurovascular intact distally. ?Neuro: Grossly normal, moves all extremities ?Psych: Normal affect and thought content ? ?   ? ?Katina Degree. Jimmey Ralph, MD ?02/24/2022 2:56 PM  ?

## 2022-02-25 ENCOUNTER — Encounter: Payer: Self-pay | Admitting: Physical Therapy

## 2022-02-25 ENCOUNTER — Telehealth: Payer: Self-pay | Admitting: Family Medicine

## 2022-02-25 ENCOUNTER — Ambulatory Visit: Payer: BC Managed Care – PPO | Admitting: Physical Therapy

## 2022-02-25 DIAGNOSIS — M542 Cervicalgia: Secondary | ICD-10-CM | POA: Diagnosis not present

## 2022-02-25 DIAGNOSIS — M6281 Muscle weakness (generalized): Secondary | ICD-10-CM | POA: Diagnosis not present

## 2022-02-25 NOTE — Therapy (Deleted)
?OUTPATIENT PHYSICAL THERAPY TREATMENT NOTE ? ? ?Patient Name: Crystal Sims ?MRN: [id number] ?DOB:1981-09-28, 41 y.o., female ?Today's Date: 02/25/2022 ? ?PCP: Midge Minium, MD ?REFERRING PROVIDER: Midge Minium, MD ? ? ? ?Past Medical History:  ?Diagnosis Date  ? Allergy   ? Chicken pox   ? Environmental allergies   ? Scoliosis   ? 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 Noted only occured once with a fever during infancy  ? Sinusitis   ? recurrent  ? ?No past surgical history on file. ?Patient Active Problem List  ? Diagnosis Date Noted  ? Allergic rhinitis due to animal (cat) (dog) hair and dander 11/20/2020  ? Allergic rhinitis due to pollen 11/20/2020  ? Chronic allergic conjunctivitis 11/20/2020  ? Gastro-esophageal reflux disease without esophagitis 11/20/2020  ? Moderate persistent asthma, uncomplicated [id number]  ? Vitamin D deficiency 11/20/2020  ? Obesity (BMI 30-39.9) 07/13/2019  ? Right foot pain 01/26/2018  ? Hematuria 11/11/2015  ? Folliculitis [id number]  ? Allergic rhinitis 05/17/2015  ? Neck pain, bilateral posterior 04/18/2015  ? Routine general medical examination at a health care facility 09/02/2012  ? ? ?PCP: Midge Minium, MD ? ?REFERRING PROVIDER: Vivi Barrack, MD ? ? ?REFERRING DIAG: M54.2 (ICD-10-CM) - Neck pain ? ?THERAPY DIAG:  ?Cervicalgia ? ?Neck pain ? ?Muscle weakness (generalized) ? ?ONSET DATE: 02/18/22  ? ?SUBJECTIVE:                                                                                                                                                                                                        ? ?SUBJECTIVE STATEMENT: ?02/25/2022 ?*** ? ?Eval: States she has been having tightness in her neck on both sides. States she can also feel a lump on the side of her neck. States that she has had it before but it usually goes away. States that this time her neck pain was worse and was painful with walking, sleeping and eating. States her pain was bad that she went to  the urgent care but it started on the 21st and it woke her up. Denies numbness or tingling. States that she feels stress makes it worse. Reports turning/checking her blind spots were difficulty and painful. States that she is working longer. States she used to walk and is no longer able to because her longer work days. ? ?PERTINENT HISTORY:  ?scoliosis ? ?PAIN:  ?Are you having pain? Yes: NPRS scale: 3/10 ?Pain location: bilateral shoulders/neck ?Pain description: stiffness ?Aggravating factors: movement ?Relieving factors: reducing stress ? ?PRECAUTIONS: None ? ?WEIGHT BEARING RESTRICTIONS No ? ?  FALLS:  ?Has patient fallen in last 6 months? No ? ?LIVING ENVIRONMENT: ?Lives in: House/apartment ?Stairs: Yes: External: 12 steps; railings on both sides ? ? ?OCCUPATION: school counselor works at [employer] ? ?PLOF: Independent ? ?PATIENT GOALS alleviate pain  ? ?OBJECTIVE:  ? ?DIAGNOSTIC FINDINGS:  ?No recent imaging ? ? ? ?COGNITION: ?Overall cognitive status: Within functional limits for tasks assessed ? ? ?SENSATION: ?WFL ? ?POSTURE:  ?Forward shoulders, forward head ? ?PALPATION: ?Increased resting tone throughout upper cervical musculature. Pain with palpation inc ervical paraspinals. Hypomobility noted throughout cervical spine  ? ?CERVICAL ROM:  ? ?Active ROM A/PROM (deg) ?02/25/2022  ?Flexion 40  ?Extension 35  ?Right lateral flexion   ?Left lateral flexion   ?Right rotation 30*  ?Left rotation 20  ? (Blank rows = not tested) ?*pain on ipsilateral side of neck ? ? ? UE Measurements ?Upper Extremity Right ?02/25/2022 Left ?02/25/2022  ? A/PROM MMT A/PROM MMT  ?Shoulder Flexion WNL 5 WNL 5  ?Shoulder Extension      ?Shoulder Abduction WNL 5 WNL 5  ?Shoulder Adduction      ?Shoulder Internal Rotation WNL 4+ WNL 4+  ?Shoulder External Rotation WNL 4+ WNL 4+  ?Elbow Flexion      ?Elbow Extension      ?Wrist Flexion      ?Wrist Extension      ?Wrist Supination      ?Wrist Pronation      ?Wrist Ulnar Deviation       ?Wrist Radial Deviation      ?Grip Strength NA  NA   ?  (Blank rows = not tested) ?  * pain ? ? ?CERVICAL SPECIAL TESTS:  ?Spurling's test: Negative and Distraction test: Positive ? ? ? ?TODAY'S TREATMENT:  ?02/25/2022 ?*** ? ?02/25/2022 ?Therapeutic Exercise: ? Aerobic: ?Supine:cervical ROT 2 minutes  ?Prone: ? Seated: ? Standing: self mobilization with tennis ball at wall 2 minutes ?Neuromuscular Re-education: ?Manual Therapy: ?Therapeutic Activity: ?Self Care: ?Trigger Point Dry Needling:  ?Modalities:  ? ? ? ?PATIENT EDUCATION:  ?Education details: on current presentation, on HEP, on rationale for exercises and ROM and POC, on benefits of self traction device ?Person educated: Patient ?Education method: Explanation, Demonstration, and Handouts ?Education comprehension: verbalized understanding ? ? ?HOME EXERCISE PROGRAM: ?[id number] ? ?ASSESSMENT: ? ?CLINICAL IMPRESSION: ?02/25/2022 ?*** ? ? ?EVAL:Patient is a 41 y.o. female who was seen today for physical therapy evaluation and treatment for bilateral neck pain. Patient with history of episodic neck pain with most recent episode being more intense and longer in duration. Patient presents with limitations in neck ROM, strength and posture that are limiting overall function. Patient would benefit from skilled PT to improve overall mobility and reduce incidence of neck pain.  ? ? ?OBJECTIVE IMPAIRMENTS decreased activity tolerance, decreased ROM, decreased strength, increased muscle spasms, postural dysfunction, and pain.  ? ?ACTIVITY LIMITATIONS community activity, driving, meal prep, and occupation.  ? ?PERSONAL FACTORS Age and 1 comorbidity: Chronic neck pain are also affecting patient's functional outcome.  ? ? ?REHAB POTENTIAL: Good ? ?CLINICAL DECISION MAKING: Stable/uncomplicated ? ?EVALUATION COMPLEXITY: Low ? ? ?GOALS: ?Goals reviewed with patient?  yes ? ?SHORT TERM GOALS: ? ?Patient will be independent in self management strategies to improve quality of  life and functional outcomes. ?Baseline: new program ?Target date: 03/25/2022 ?Goal status: INITIAL ? ?2.  Patient will report at least 50% improvement in overall symptoms and/or function to demonstrate improved functional mobility ?Baseline: 0% ?Target date: 03/25/2022 ?Goal status: INITIAL ? ?  3.  Patient will be able to demonstrate at least 45 degrees of cervical ROT bilaterally to improve ability to check blind spots while driving. ?Baseline: see above ?Target date: 03/25/2022 ?Goal status: INITIAL ? ? ? ? ?LONG TERM GOALS: ? ?Patient will report at least 75% improvement in overall symptoms and/or function to demonstrate improved functional mobility ?Baseline: 0% ?Target date: 04/22/2022 ?Goal status: INITIAL ? ?2.   Patient will be able to demonstrate at least 60 degrees of cervical ROT bilaterally to improve ability to check blind spots while driving. ?Baseline: see above ?Target date: 04/22/2022 ?Goal status: INITIAL ? ?3.  Patient will report performing neck mobility exercises daily to improve overall mobility and reduce incidence of neck pain. ?Baseline: none currently ?Target date: 04/22/2022 ?Goal status: INITIAL ? ? ? ?PLAN: ?PT FREQUENCY: 1x/week ? ?PT DURATION: 8 weeks ? ?PLANNED INTERVENTIONS: Therapeutic exercises, Therapeutic activity, Neuromuscular re-education, Balance training, Gait training, Patient/Family education, Joint manipulation, Joint mobilization, Dry Needling, Electrical stimulation, Spinal manipulation, Moist heat, Ionotophoresis 4mg /ml Dexamethasone, and Manual therapy ? ?PLAN FOR NEXT SESSION: cervical traction, self occipital release, chin tuck, f/u with traction device ? ? ?3:15 PM, 02/25/22 ?Jerene Pitch, DPT ?Physical Therapy with [place] ?[facility]  ?[phone] office ? ?   ?

## 2022-02-28 NOTE — Telephone Encounter (Signed)
error 

## 2022-03-03 ENCOUNTER — Encounter: Payer: BC Managed Care – PPO | Admitting: Physical Therapy

## 2022-03-10 ENCOUNTER — Encounter: Payer: BC Managed Care – PPO | Admitting: Physical Therapy

## 2022-03-17 ENCOUNTER — Encounter: Payer: BC Managed Care – PPO | Admitting: Physical Therapy

## 2022-03-18 ENCOUNTER — Other Ambulatory Visit: Payer: Self-pay | Admitting: Family Medicine

## 2022-03-23 ENCOUNTER — Other Ambulatory Visit: Payer: Self-pay | Admitting: Family Medicine

## 2022-03-24 ENCOUNTER — Encounter: Payer: BC Managed Care – PPO | Admitting: Physical Therapy

## 2022-03-25 ENCOUNTER — Encounter: Payer: BC Managed Care – PPO | Admitting: Physical Therapy

## 2022-04-13 ENCOUNTER — Other Ambulatory Visit: Payer: Self-pay | Admitting: Family Medicine

## 2022-06-19 DIAGNOSIS — N840 Polyp of corpus uteri: Secondary | ICD-10-CM | POA: Insufficient documentation

## 2022-06-21 ENCOUNTER — Other Ambulatory Visit: Payer: Self-pay | Admitting: Family Medicine

## 2022-07-28 ENCOUNTER — Other Ambulatory Visit: Payer: Self-pay | Admitting: Family Medicine

## 2022-07-28 DIAGNOSIS — J4540 Moderate persistent asthma, uncomplicated: Secondary | ICD-10-CM

## 2022-07-28 DIAGNOSIS — R059 Cough, unspecified: Secondary | ICD-10-CM

## 2022-08-07 ENCOUNTER — Encounter: Payer: Self-pay | Admitting: Family Medicine

## 2022-08-07 ENCOUNTER — Ambulatory Visit: Payer: BC Managed Care – PPO | Admitting: Family Medicine

## 2022-08-07 ENCOUNTER — Telehealth (INDEPENDENT_AMBULATORY_CARE_PROVIDER_SITE_OTHER): Payer: BC Managed Care – PPO | Admitting: Family Medicine

## 2022-08-07 VITALS — Temp 98.0°F | Wt 189.0 lb

## 2022-08-07 DIAGNOSIS — U071 COVID-19: Secondary | ICD-10-CM | POA: Diagnosis not present

## 2022-08-07 MED ORDER — MOLNUPIRAVIR EUA 200MG CAPSULE: 4.0000 | ORAL_CAPSULE | Freq: Two times a day (BID) | ORAL | 0 refills | Status: AC

## 2022-08-07 MED ORDER — GUAIFENESIN-CODEINE 100-10 MG/5ML PO SYRP: 10.0000 mL | ORAL_SOLUTION | Freq: Three times a day (TID) | ORAL | 0 refills | Status: DC | PRN

## 2022-08-07 NOTE — Progress Notes (Signed)
Virtual Visit via Video   I connected with patient on 08/07/22 at  9:20 AM EDT by a video enabled telemedicine application and verified that I am speaking with the correct person using two identifiers.  Location patient: Home Location provider: [place] [facility], Office Persons participating in the virtual visit: Patient, Provider, CMA Sheryle Hail C)  I discussed the limitations of evaluation and management by telemedicine and the availability of in person appointments. The patient expressed understanding and agreed to proceed.  Subjective:   HPI:   COVID- sxs started Monday w/ nasal congestion.  Then developed cough, body aches, chills.  Took home COVID test yesterday and was +.  Taking OTC sinus medication and cough syrup.  She reports body aches are 'minor'.  Pt reports cough is severe.  Intermittently productive.  Denies fever.  Denies SOB.    ROS:   See pertinent positives and negatives per HPI.  Patient Active Problem List   Diagnosis Date Noted   Endometrial polyp 06/19/2022   Allergic rhinitis due to animal (cat) (dog) hair and dander 11/20/2020   Allergic rhinitis due to pollen 11/20/2020   Chronic allergic conjunctivitis 11/20/2020   Gastro-esophageal reflux disease without esophagitis 11/20/2020   Moderate persistent asthma, uncomplicated 11/20/2020   Vitamin D deficiency 11/20/2020   History of multiple allergies 10/01/2020   Obesity (BMI 30-39.9) 07/13/2019   Family history of breast cancer 12/29/2018   Right foot pain 01/26/2018   Hematuria 11/11/2015   Folliculitis 05/17/2015   Allergic rhinitis 05/17/2015   Neck pain, bilateral posterior 04/18/2015   Routine general medical examination at a health care facility 09/02/2012    Social History   Tobacco Use   Smoking status: Never   Smokeless tobacco: Never  Substance Use Topics   Alcohol use: No    Current Outpatient Medications:    albuterol (PROAIR HFA) 108 (90 Base) MCG/ACT inhaler, Inhale 2 puffs  into the lungs every 6 (six) hours as needed for wheezing or shortness of breath., Disp: 1 each, Rfl: 0   EPINEPHrine 0.3 mg/0.3 mL IJ SOAJ injection, See admin instructions., Disp: , Rfl:    ipratropium (ATROVENT) 0.03 % nasal spray, Place 2 sprays into both nostrils 2 (two) times daily., Disp: 30 mL, Rfl: 0   levocetirizine (XYZAL) 5 MG tablet, , Disp: , Rfl: 3   meloxicam (MOBIC) 15 MG tablet, TAKE 1 TABLET (15 MG TOTAL) BY MOUTH DAILY., Disp: 30 tablet, Rfl: 0   mometasone (NASONEX) 50 MCG/ACT nasal spray, PLACE 2 SPRAYS INTO THE NOSE DAILY., Disp: 17 each, Rfl: 1   montelukast (SINGULAIR) 10 MG tablet, , Disp: , Rfl: 3   Multiple Vitamin (MULTIVITAMIN) tablet, Take 1 tablet by mouth daily. Reported on 01/05/2016, Disp: , Rfl:    spironolactone (ALDACTONE) 100 MG tablet, Take 100 mg by mouth daily., Disp: , Rfl:    tretinoin (RETIN-A) 0.025 % cream, Apply topically at bedtime., Disp: , Rfl:    IRON PO, Take 1 tablet by mouth daily., Disp: , Rfl:    VITAMIN D PO, Take by mouth daily., Disp: , Rfl:   No Known Allergies  Objective:   Temp 98 F (36.7 C) (Oral)   Wt 189 lb (85.7 kg)   BMI 31.45 kg/m  AAOx3, NAD NCAT, EOMI No obvious CN deficits Coloring WNL Pt is able to speak clearly, coherently without shortness of breath or increased work of breathing.  + cough Thought process is linear.  Mood is appropriate.   Assessment and Plan:  COVID- currently pt feels that sxs are mild w/ exception of cough.  Will send in codeine based cough syrup for symptom relief.  Discussed that she is in the window for antiviral medication but given that she is currently feeling 'ok' she is not sure if she wants to take it.  Will send to pharmacy so that if sxs worsen in the next 1-2 days she can take it if she chooses.  Encouraged Vit D, Vit C, and Zinc.  Push fluids.  Rest.  Note provided for work.  Pt expressed understanding and is in agreement w/ plan.    Neena Rhymes, MD 08/07/2022

## 2022-08-11 ENCOUNTER — Telehealth: Payer: Self-pay | Admitting: Family Medicine

## 2022-08-11 MED ORDER — GUAIFENESIN-CODEINE 100-10 MG/5ML PO SYRP: 10.0000 mL | ORAL_SOLUTION | Freq: Three times a day (TID) | ORAL | 0 refills | Status: DC | PRN

## 2022-08-11 NOTE — Telephone Encounter (Signed)
Prescription sent to pharmacy.

## 2022-08-11 NOTE — Telephone Encounter (Signed)
Pt reports out of robitussin rx and would like more last visit 08/07/22 for COVID

## 2022-08-11 NOTE — Telephone Encounter (Signed)
Encourage patient to contact the pharmacy for refills or they can request refills through [facility]  (Please schedule appointment if patient has not been seen in over a year)    WHAT PHARMACY WOULD THEY LIKE THIS SENT TO: [facility] [phone]  MEDICATION NAME & DOSE:robitussin ac 100-10mg  syrup  NOTES/COMMENTS FROM PATIENT: pt ran out, but is still coughing; pt also wants an rx for her sinus drainage. Please advise.       Front office please notify patient: It takes 48-72 hours to process rx refill requests Ask patient to call pharmacy to ensure rx is ready before heading there.

## 2022-09-22 ENCOUNTER — Other Ambulatory Visit: Payer: Self-pay | Admitting: Family Medicine

## 2022-10-17 ENCOUNTER — Ambulatory Visit (INDEPENDENT_AMBULATORY_CARE_PROVIDER_SITE_OTHER): Payer: BC Managed Care – PPO | Admitting: Family Medicine

## 2022-10-17 ENCOUNTER — Encounter: Payer: Self-pay | Admitting: Family Medicine

## 2022-10-17 VITALS — BP 116/80 | HR 93 | Temp 99.0°F | Resp 17 | Ht 65.0 in | Wt 190.2 lb

## 2022-10-17 DIAGNOSIS — J040 Acute laryngitis: Secondary | ICD-10-CM | POA: Diagnosis not present

## 2022-10-17 MED ORDER — PREDNISONE 10 MG PO TABS: ORAL_TABLET | ORAL | 0 refills | Status: DC

## 2022-10-17 NOTE — Patient Instructions (Addendum)
Follow up as needed or as scheduled START the Prednisone as directed- take w/ food LOTS of fluids Vocal rest when possible Take the Xyzal and Singulair daily Call with any questions or concerns Happy Holidays!!!

## 2022-10-17 NOTE — Progress Notes (Signed)
   Subjective:    Patient ID: Crystal Sims, female    DOB: 1981/09/01, 41 y.o.   MRN: [id number]  HPI Hoarseness- lost voice yesterday.  Developed post nasal drip last week.  Minimal cough but does have some chest tightness.  Denies HA, fever.  Mild frontal sinus pressure.  No ear pain.  Mild sore throat.  + sick contacts (works in school)   Review of Systems For ROS see HPI     Objective:   Physical Exam Vitals reviewed.  Constitutional:      General: She is not in acute distress.    Appearance: Normal appearance. She is well-developed. She is not ill-appearing.  HENT:     Head: Normocephalic and atraumatic.     Right Ear: Tympanic membrane normal.     Left Ear: Tympanic membrane normal.     Nose: Mucosal edema and congestion present. No rhinorrhea.     Right Sinus: No maxillary sinus tenderness or frontal sinus tenderness.     Left Sinus: No maxillary sinus tenderness or frontal sinus tenderness.     Mouth/Throat:     Pharynx: Posterior oropharyngeal erythema (w/ PND) present.     Comments: + laryngitis Eyes:     Conjunctiva/sclera: Conjunctivae normal.     Pupils: Pupils are equal, round, and reactive to light.  Cardiovascular:     Rate and Rhythm: Normal rate and regular rhythm.     Heart sounds: Normal heart sounds.  Pulmonary:     Effort: Pulmonary effort is normal. No respiratory distress.     Breath sounds: Normal breath sounds. No wheezing or rales.     Comments: + dry cough Musculoskeletal:     Cervical back: Normal range of motion and neck supple.  Lymphadenopathy:     Cervical: No cervical adenopathy.  Skin:    General: Skin is warm and dry.  Neurological:     General: No focal deficit present.     Mental Status: She is alert and oriented to person, place, and time.  Psychiatric:        Mood and Affect: Mood normal.        Behavior: Behavior normal.        Thought Content: Thought content normal.           Assessment & Plan:  Laryngitis- new.   Likely due to PND.  No obvious bacterial infxn.  Given dry cough, PND, laryngitis, and chest tightness will start Prednisone to treat airway inflammation and congestion.  Reviewed supportive care and red flags that should prompt return.  Pt expressed understanding and is in agreement w/ plan.

## 2022-10-21 ENCOUNTER — Telehealth: Payer: Self-pay

## 2022-10-21 NOTE — Telephone Encounter (Signed)
This is likely a virus and should improve w/ time (most last from 7-10 days).  Mucous will change color as the illness goes on- from clear, to yellow, to green, back to yellow and back to clear.  Would start Mucinex DM to thin congestion and help w/ cough.  Lots of fluids.  If worsening rather than improving, will need appt to assess for bacterial infection

## 2022-10-21 NOTE — Telephone Encounter (Signed)
Spoke to the pt and informed her of Dr Beverely Low message. Advised Mucinex DM and if symptoms worsen she will need an apt . Pt expressed verbal understanding

## 2022-10-21 NOTE — Telephone Encounter (Signed)
Notes coughing now and she doesn't seem to be improving notes mucous is yellow green. Wondered if theres more she can do at this time

## 2022-10-28 ENCOUNTER — Encounter: Payer: Self-pay | Admitting: Family Medicine

## 2022-10-28 ENCOUNTER — Ambulatory Visit (INDEPENDENT_AMBULATORY_CARE_PROVIDER_SITE_OTHER): Payer: BC Managed Care – PPO | Admitting: Family Medicine

## 2022-10-28 VITALS — BP 132/80 | HR 75 | Temp 97.8°F | Resp 16 | Ht 65.0 in | Wt 199.4 lb

## 2022-10-28 DIAGNOSIS — B9689 Other specified bacterial agents as the cause of diseases classified elsewhere: Secondary | ICD-10-CM | POA: Diagnosis not present

## 2022-10-28 DIAGNOSIS — J329 Chronic sinusitis, unspecified: Secondary | ICD-10-CM

## 2022-10-28 MED ORDER — AMOXICILLIN 875 MG PO TABS: 875.0000 mg | ORAL_TABLET | Freq: Two times a day (BID) | ORAL | 0 refills | Status: AC

## 2022-10-28 MED ORDER — GUAIFENESIN-CODEINE 100-10 MG/5ML PO SYRP: 10.0000 mL | ORAL_SOLUTION | Freq: Three times a day (TID) | ORAL | 0 refills | Status: DC | PRN

## 2022-10-28 NOTE — Progress Notes (Signed)
   Subjective:    Patient ID: Crystal Sims, female    DOB: [birth year], 40 y.o.   MRN: [id number]  HPI Sinus pressure- pt was seen 11/17 w/ laryngitis.  Since then she has developed a productive cough, sinus pressure/pain.  The other night had body aches.  + HA- particularly w/ leaning forward.  Some tooth pain.  No ear pain.  Has been taking Tylenol so unclear if fevers.     Review of Systems For ROS see HPI     Objective:   Physical Exam Vitals reviewed.  Constitutional:      General: She is not in acute distress.    Appearance: Normal appearance. She is well-developed. She is not ill-appearing.  HENT:     Head: Normocephalic and atraumatic.     Right Ear: Tympanic membrane normal.     Left Ear: Tympanic membrane normal.     Nose: Mucosal edema, congestion and rhinorrhea present.     Right Sinus: Maxillary sinus tenderness and frontal sinus tenderness present.     Left Sinus: Maxillary sinus tenderness and frontal sinus tenderness present.     Mouth/Throat:     Pharynx: Uvula midline. Posterior oropharyngeal erythema present. No oropharyngeal exudate.  Eyes:     Conjunctiva/sclera: Conjunctivae normal.     Pupils: Pupils are equal, round, and reactive to light.  Cardiovascular:     Rate and Rhythm: Normal rate and regular rhythm.     Heart sounds: Normal heart sounds.  Pulmonary:     Effort: Pulmonary effort is normal. No respiratory distress.     Breath sounds: Normal breath sounds. No wheezing.     Comments: Near constant cough Musculoskeletal:     Cervical back: Normal range of motion and neck supple.  Lymphadenopathy:     Cervical: No cervical adenopathy.  Skin:    General: Skin is warm and dry.  Neurological:     General: No focal deficit present.     Mental Status: She is alert and oriented to person, place, and time.     Cranial Nerves: No cranial nerve deficit.     Motor: No weakness.     Coordination: Coordination normal.  Psychiatric:        Mood and  Affect: Mood normal.        Behavior: Behavior normal.        Thought Content: Thought content normal.           Assessment & Plan:  Bacterial sinusitis- pt's worsening sxs rather than improvement after 10 days is consistent w/ bacterial infxn.  Start abx.  Cough meds prn.  Reviewed supportive care and red flags that should prompt return.  Pt expressed understanding and is in agreement w/ plan.

## 2022-10-28 NOTE — Patient Instructions (Signed)
Follow up as needed or as scheduled START the Amoxicillin twice daily- take w/ food Drink LOTS of fluids Use the cough syrup as needed- may cause drowsiness Call with any questions or concerns Hang in there! Happy Holidays!

## 2022-11-05 ENCOUNTER — Telehealth: Payer: Self-pay | Admitting: Family Medicine

## 2022-11-05 MED ORDER — GUAIFENESIN-CODEINE 100-10 MG/5ML PO SYRP: 10.0000 mL | ORAL_SOLUTION | Freq: Three times a day (TID) | ORAL | 0 refills | Status: DC | PRN

## 2022-11-05 NOTE — Telephone Encounter (Addendum)
Pt notes out of robitussin and last abx, notes sxs have improved but notes continued drainage at night and will wake up with sore throat, should she have a refill or finish up meds and wait see how she progresses?

## 2022-11-05 NOTE — Telephone Encounter (Signed)
Caller name: Myeisha Kruser  On DPR?: Yes  Call back number: [phone] (mobile)  Provider they see: Sheliah Hatch, MD  Reason for call: Patient had an appt with Tabori on 10/28/22. Pt states that she is out the Robitussin [facility] and she has one capsule left on her antibiotic. Pt want to know should she reschedule another appt or do she need one more refill on medications.   [id number] [place], [place] - [address]  This patient pharmacy.

## 2022-11-06 NOTE — Telephone Encounter (Signed)
Called and informed patient. 

## 2022-11-18 ENCOUNTER — Telehealth: Payer: Self-pay | Admitting: Family Medicine

## 2022-11-18 DIAGNOSIS — J3489 Other specified disorders of nose and nasal sinuses: Secondary | ICD-10-CM

## 2022-11-18 MED ORDER — IPRATROPIUM BROMIDE 0.03 % NA SOLN: 2.0000 | Freq: Two times a day (BID) | NASAL | 3 refills | Status: DC

## 2022-11-18 NOTE — Telephone Encounter (Signed)
Please advise 

## 2022-11-18 NOTE — Telephone Encounter (Signed)
She needs to be taking her daily allergy medication- Claritin, Zyrtec, Xyzal, Allegra, etc- as well as the Singulair (Montelukast) that was prescribed.  She should continue to use her daily nasal steroid (Flonase, Nasacort, Nasonex, etc).  We will also add a 2nd nasal spray- Ipratropium- to improve her sxs.  If no improvement at that time, recommend seeing ENT or allergist

## 2022-11-18 NOTE — Telephone Encounter (Signed)
Caller name: Lane Kjos  On DPR?: Yes  Call back number: [phone] (mobile)  Provider they see: Sheliah Hatch, MD  Reason for call: Patient called stating that she had a OV with Dr.Tabori on 10/28/22. Patient still has a little cough and congestion. Patient is more concerned about the post nasal drip she is currently having . Patient wants to know if she need to make another appt with Dr.Tabori or do she need to see a ENT doctor about this? Pt also stated that the robitussin ac made her too sleepy. Patient pharmacy is  [id number] - [place], [place] - [address]

## 2022-11-19 NOTE — Telephone Encounter (Signed)
Spoke w/ pt and informed her of Dr Beverely Low message . She is currently taking a daily allergy medication and nasal spray . She states she is going to consider seeing an ENT

## 2022-11-19 NOTE — Telephone Encounter (Signed)
Close please °

## 2022-11-28 ENCOUNTER — Encounter: Payer: BC Managed Care – PPO | Admitting: Family Medicine

## 2022-12-15 ENCOUNTER — Encounter: Payer: BC Managed Care – PPO | Admitting: Family Medicine

## 2022-12-20 ENCOUNTER — Other Ambulatory Visit: Payer: Self-pay | Admitting: Family Medicine

## 2023-01-01 ENCOUNTER — Ambulatory Visit (INDEPENDENT_AMBULATORY_CARE_PROVIDER_SITE_OTHER): Payer: BC Managed Care – PPO | Admitting: Family Medicine

## 2023-01-01 ENCOUNTER — Encounter: Payer: Self-pay | Admitting: Family Medicine

## 2023-01-01 VITALS — BP 128/80 | HR 96 | Temp 97.7°F | Resp 17 | Ht 65.0 in | Wt 189.2 lb

## 2023-01-01 DIAGNOSIS — J329 Chronic sinusitis, unspecified: Secondary | ICD-10-CM | POA: Diagnosis not present

## 2023-01-01 DIAGNOSIS — B9689 Other specified bacterial agents as the cause of diseases classified elsewhere: Secondary | ICD-10-CM | POA: Diagnosis not present

## 2023-01-01 MED ORDER — AMOXICILLIN 875 MG PO TABS: 875.0000 mg | ORAL_TABLET | Freq: Two times a day (BID) | ORAL | 0 refills | Status: AC

## 2023-01-01 MED ORDER — PREDNISONE 10 MG PO TABS: ORAL_TABLET | ORAL | 0 refills | Status: DC

## 2023-01-01 NOTE — Progress Notes (Signed)
   Subjective:    Patient ID: Crystal Sims, female    DOB: May 07, 1981, 42 y.o.   MRN: [id number]  HPI Sinusitis- pt was seen 6 days ago at UC due to 'terrible HA' and dx'd w/ 'pansinusitis'.  COVID and flu tests were negative.  Was tx'd w/ steroid injxn- dexamethasone.  + frontal and maxillary sinus pain.  + congestion.  Taking Mucinex DM w/ some relief.  + tooth pain.  R ear pain.  No fever.   Review of Systems For ROS see HPI     Objective:   Physical Exam Vitals reviewed.  Constitutional:      General: She is not in acute distress.    Appearance: Normal appearance. She is well-developed. She is not ill-appearing.  HENT:     Head: Normocephalic and atraumatic.     Right Ear: Tympanic membrane normal.     Left Ear: Tympanic membrane normal.     Nose: Mucosal edema and congestion present. No rhinorrhea.     Right Sinus: Maxillary sinus tenderness and frontal sinus tenderness present.     Left Sinus: Maxillary sinus tenderness and frontal sinus tenderness present.     Mouth/Throat:     Pharynx: Uvula midline. Posterior oropharyngeal erythema present. No oropharyngeal exudate.  Eyes:     Conjunctiva/sclera: Conjunctivae normal.     Pupils: Pupils are equal, round, and reactive to light.  Cardiovascular:     Rate and Rhythm: Normal rate and regular rhythm.     Heart sounds: Normal heart sounds.  Pulmonary:     Effort: Pulmonary effort is normal. No respiratory distress.     Breath sounds: Normal breath sounds. No wheezing.  Musculoskeletal:     Cervical back: Normal range of motion and neck supple.  Lymphadenopathy:     Cervical: No cervical adenopathy.  Skin:    General: Skin is warm and dry.  Neurological:     General: No focal deficit present.     Mental Status: She is alert and oriented to person, place, and time.     Cranial Nerves: No cranial nerve deficit.     Motor: No weakness.     Coordination: Coordination normal.  Psychiatric:        Mood and Affect: Mood  normal.        Behavior: Behavior normal.        Thought Content: Thought content normal.           Assessment & Plan:  Bacterial sinusitis- pt's sxs and PE consistent w/ infxn.  Start Amoxicillin BID.  Reviewed supportive care and red flags that should prompt return.  Pt expressed understanding and is in agreement w/ plan.

## 2023-01-01 NOTE — Patient Instructions (Signed)
Follow up as needed or as scheduled START the Amoxicillin twice daily- take w/ food Drink LOTS of fluids Continue Mucinex DM as needed Alternate tylenol and ibuprofen for headaches Call with any questions or concerns Hang in there!

## 2023-01-23 ENCOUNTER — Encounter: Payer: Self-pay | Admitting: Family Medicine

## 2023-01-23 ENCOUNTER — Ambulatory Visit (INDEPENDENT_AMBULATORY_CARE_PROVIDER_SITE_OTHER): Payer: BC Managed Care – PPO | Admitting: Family Medicine

## 2023-01-23 VITALS — BP 124/84 | HR 92 | Temp 97.9°F | Resp 17 | Ht 65.0 in | Wt 191.2 lb

## 2023-01-23 DIAGNOSIS — Z0000 Encounter for general adult medical examination without abnormal findings: Secondary | ICD-10-CM

## 2023-01-23 DIAGNOSIS — E559 Vitamin D deficiency, unspecified: Secondary | ICD-10-CM | POA: Diagnosis not present

## 2023-01-23 DIAGNOSIS — E669 Obesity, unspecified: Secondary | ICD-10-CM

## 2023-01-23 NOTE — Progress Notes (Signed)
   Subjective:    Patient ID: Crystal Sims, female    DOB: [birth year], 42 y.o.   MRN: [id number]  HPI CPE- UTD on pap, mammo, Tdap.  Patient Care Team    Relationship Specialty Notifications Start End  Midge Minium, MD PCP - General Family Medicine  09/02/12   Mosetta Anis, MD Referring Physician Allergy  10/28/17   Linda Hedges, DO Consulting Physician Obstetrics and Gynecology  07/13/19     Health Maintenance  Topic Date Due   INFLUENZA VACCINE  03/01/2023 (Originally 07/01/2022)   PAP SMEAR-Modifier  11/09/2023   Hepatitis C Screening  Completed   HIV Screening  Completed   HPV VACCINES  Aged Out   DTaP/Tdap/Td  Discontinued   COVID-19 Vaccine  Discontinued      Review of Systems Patient reports no vision/ hearing changes, adenopathy,fever, weight change,  persistant/recurrent hoarseness , swallowing issues, chest pain, palpitations, edema, persistant/recurrent cough, hemoptysis, dyspnea (rest/exertional/paroxysmal nocturnal), gastrointestinal bleeding (melena, rectal bleeding), abdominal pain, significant heartburn, bowel changes, GU symptoms (dysuria, hematuria, incontinence), Gyn symptoms (abnormal  bleeding, pain),  syncope, focal weakness, memory loss, numbness & tingling, skin/hair/nail changes, abnormal bruising or bleeding, anxiety, or depression.     Objective:   Physical Exam General Appearance:    Alert, cooperative, no distress, appears stated age  Head:    Normocephalic, without obvious abnormality, atraumatic  Eyes:    PERRL, conjunctiva/corneas clear, EOM's intact both eyes  Ears:    Normal TM's and external ear canals, both ears  Nose:   Nares normal, septum midline, mucosa normal, no drainage    or sinus tenderness  Throat:   Lips, mucosa, and tongue normal; teeth and gums normal  Neck:   Supple, symmetrical, trachea midline, no adenopathy;    Thyroid: no enlargement/tenderness/nodules  Back:     Symmetric, no curvature, ROM normal, no CVA tenderness   Lungs:     Clear to auscultation bilaterally, respirations unlabored  Chest Wall:    No tenderness or deformity   Heart:    Regular rate and rhythm, S1 and S2 normal, no murmur, rub   or gallop  Breast Exam:    Deferred to GYN  Abdomen:     Soft, non-tender, bowel sounds active all four quadrants,    no masses, no organomegaly  Genitalia:    Deferred to GYN  Rectal:    Extremities:   Extremities normal, atraumatic, no cyanosis or edema  Pulses:   2+ and symmetric all extremities  Skin:   Skin color, texture, turgor normal, no rashes or lesions  Lymph nodes:   Cervical, supraclavicular, and axillary nodes normal  Neurologic:   CNII-XII intact, normal strength, sensation and reflexes    throughout          Assessment & Plan:

## 2023-01-23 NOTE — Assessment & Plan Note (Signed)
Check labs and replete prn. 

## 2023-01-23 NOTE — Patient Instructions (Signed)
Follow up in 1 year or as needed We'll notify you of your lab results and make any changes if needed Continue to work on healthy diet and regular exercise- you look great! Call with any questions or concerns Stay Safe!  Stay Healthy!

## 2023-01-23 NOTE — Assessment & Plan Note (Signed)
Pt's PE WNL w/ exception of BMI.  UTD on pap, mammo, Tdap.  Check labs.  Anticipatory guidance provided.

## 2023-01-23 NOTE — Assessment & Plan Note (Signed)
Ongoing issue for pt.  Weight is stable, BMI 31.83.  Encouraged low carb diet and regular exercise.  Check labs to risk stratify.  Will follow.

## 2023-01-24 LAB — CBC WITH DIFFERENTIAL/PLATELET
Absolute Monocytes: 585 cells/uL (ref 200–950)
Basophils Absolute: 27 cells/uL (ref 0–200)
Basophils Relative: 0.4 %
Eosinophils Absolute: 68 cells/uL (ref 15–500)
Eosinophils Relative: 1 %
HCT: 31.9 % — ABNORMAL LOW (ref 35.0–45.0)
Hemoglobin: 10 g/dL — ABNORMAL LOW (ref 11.7–15.5)
Lymphs Abs: 2754 cells/uL (ref 850–3900)
MCH: 25.1 pg — ABNORMAL LOW (ref 27.0–33.0)
MCHC: 31.3 g/dL — ABNORMAL LOW (ref 32.0–36.0)
MCV: 80.2 fL (ref 80.0–100.0)
MPV: 10.1 fL (ref 7.5–12.5)
Monocytes Relative: 8.6 %
Neutro Abs: 3366 cells/uL (ref 1500–7800)
Neutrophils Relative %: 49.5 %
Platelets: 489 10*3/uL — ABNORMAL HIGH (ref 140–400)
RBC: 3.98 10*6/uL (ref 3.80–5.10)
RDW: 13 % (ref 11.0–15.0)
Total Lymphocyte: 40.5 %
WBC: 6.8 10*3/uL (ref 3.8–10.8)

## 2023-01-24 LAB — TSH: TSH: 0.81 mIU/L

## 2023-01-24 LAB — HEPATIC FUNCTION PANEL
AG Ratio: 1.3 (calc) (ref 1.0–2.5)
ALT: 15 U/L (ref 6–29)
AST: 17 U/L (ref 10–30)
Albumin: 4 g/dL (ref 3.6–5.1)
Alkaline phosphatase (APISO): 68 U/L (ref 31–125)
Bilirubin, Direct: 0.1 mg/dL (ref 0.0–0.2)
Globulin: 3.2 g/dL (calc) (ref 1.9–3.7)
Indirect Bilirubin: 0.1 mg/dL (calc) — ABNORMAL LOW (ref 0.2–1.2)
Total Bilirubin: 0.2 mg/dL (ref 0.2–1.2)
Total Protein: 7.2 g/dL (ref 6.1–8.1)

## 2023-01-24 LAB — LIPID PANEL
Cholesterol: 126 mg/dL (ref <200)
HDL: 41 mg/dL — ABNORMAL LOW (ref >=50)
LDL Cholesterol (Calc): 60 mg/dL (calc)
Non-HDL Cholesterol (Calc): 85 mg/dL (calc) (ref <130)
Total CHOL/HDL Ratio: 3.1 (calc) (ref <5.0)
Triglycerides: 172 mg/dL — ABNORMAL HIGH (ref <150)

## 2023-01-24 LAB — BASIC METABOLIC PANEL
BUN: 8 mg/dL (ref 7–25)
CO2: 22 mmol/L (ref 20–32)
Calcium: 9.4 mg/dL (ref 8.6–10.2)
Chloride: 101 mmol/L (ref 98–110)
Creat: 0.67 mg/dL (ref 0.50–0.99)
Glucose, Bld: 104 mg/dL — ABNORMAL HIGH (ref 65–99)
Potassium: 4.3 mmol/L (ref 3.5–5.3)
Sodium: 138 mmol/L (ref 135–146)

## 2023-01-24 LAB — VITAMIN D 25 HYDROXY (VIT D DEFICIENCY, FRACTURES): Vit D, 25-Hydroxy: 40 ng/mL (ref 30–100)

## 2023-01-26 ENCOUNTER — Telehealth: Payer: Self-pay

## 2023-01-26 NOTE — Telephone Encounter (Signed)
She called back

## 2023-01-26 NOTE — Telephone Encounter (Signed)
Left VM for pt to call office in regards to lab results

## 2023-01-26 NOTE — Telephone Encounter (Signed)
Informed pt of lab results  

## 2023-01-26 NOTE — Telephone Encounter (Signed)
-----   Message from Midge Minium, MD sent at 01/24/2023  4:36 PM EST ----- Labs look great w/ exception of mildly low hemoglobin (blood count).  This is common in menstruating women due to period blood loss.  Please make sure you are taking a daily multivitamin that contains iron.

## 2023-02-16 ENCOUNTER — Other Ambulatory Visit: Payer: Self-pay | Admitting: Otolaryngology

## 2023-02-16 DIAGNOSIS — J329 Chronic sinusitis, unspecified: Secondary | ICD-10-CM

## 2023-02-25 ENCOUNTER — Ambulatory Visit
Admission: RE | Admit: 2023-02-25 | Discharge: 2023-02-25 | Disposition: A | Discharge status: Home / Self Care | Payer: BC Managed Care – PPO | Source: Ambulatory Visit | Attending: Otolaryngology | Admitting: Otolaryngology

## 2023-02-25 ENCOUNTER — Encounter: Payer: Self-pay | Admitting: Radiology

## 2023-02-25 DIAGNOSIS — J329 Chronic sinusitis, unspecified: Secondary | ICD-10-CM

## 2023-03-02 ENCOUNTER — Other Ambulatory Visit: Payer: Self-pay | Admitting: Family Medicine

## 2023-03-13 ENCOUNTER — Other Ambulatory Visit: Payer: BC Managed Care – PPO

## 2023-03-30 ENCOUNTER — Encounter: Payer: Self-pay | Admitting: Family Medicine

## 2023-03-30 ENCOUNTER — Ambulatory Visit: Payer: BC Managed Care – PPO | Admitting: Family Medicine

## 2023-03-30 VITALS — BP 126/82 | HR 98 | Temp 97.7°F | Resp 17 | Ht 65.0 in | Wt 190.2 lb

## 2023-03-30 DIAGNOSIS — B9689 Other specified bacterial agents as the cause of diseases classified elsewhere: Secondary | ICD-10-CM

## 2023-03-30 DIAGNOSIS — J329 Chronic sinusitis, unspecified: Secondary | ICD-10-CM | POA: Diagnosis not present

## 2023-03-30 MED ORDER — AZELASTINE HCL 0.1 % NA SOLN: 1.0000 | Freq: Two times a day (BID) | NASAL | 5 refills | Status: AC

## 2023-03-30 MED ORDER — ALBUTEROL SULFATE HFA 108 (90 BASE) MCG/ACT IN AERS: 2.0000 | INHALATION_SPRAY | Freq: Four times a day (QID) | RESPIRATORY_TRACT | 0 refills | Status: DC | PRN

## 2023-03-30 NOTE — Progress Notes (Signed)
   Subjective:    Patient ID: Crystal Sims, female    DOB: [birth year], 41 y.o.   MRN: [id number]  HPI Sinusitis- sxs started Friday, went to UC on Saturday and was started on Augmentin and Prednisone.  Reports being congested to the point she can't breathe out of her nose.  Cough is now painful.  + sneezing.  Nasal drainage is mostly clear.  Taking Singular, Nasonex, Xyzal,    Review of Systems For ROS see HPI     Objective:   Physical Exam Vitals reviewed.  Constitutional:      General: She is not in acute distress.    Appearance: Normal appearance. She is well-developed. She is not ill-appearing.  HENT:     Head: Normocephalic and atraumatic.     Right Ear: Tympanic membrane and ear canal normal.     Left Ear: Tympanic membrane and ear canal normal.     Nose: Mucosal edema and congestion present. No rhinorrhea.     Right Sinus: Maxillary sinus tenderness and frontal sinus tenderness present.     Left Sinus: Maxillary sinus tenderness and frontal sinus tenderness present.     Mouth/Throat:     Mouth: Mucous membranes are moist.     Pharynx: Uvula midline.  Eyes:     Conjunctiva/sclera: Conjunctivae normal.     Pupils: Pupils are equal, round, and reactive to light.  Cardiovascular:     Rate and Rhythm: Normal rate and regular rhythm.     Heart sounds: Normal heart sounds.  Pulmonary:     Effort: Pulmonary effort is normal. No respiratory distress.     Breath sounds: Normal breath sounds. No wheezing.     Comments: Dry, hacking cough Musculoskeletal:     Cervical back: Normal range of motion and neck supple.  Lymphadenopathy:     Cervical: No cervical adenopathy.  Skin:    General: Skin is warm and dry.  Neurological:     General: No focal deficit present.     Mental Status: She is oriented to person, place, and time.     Cranial Nerves: No cranial nerve deficit.     Motor: No weakness.     Coordination: Coordination normal.  Psychiatric:        Mood and Affect: Mood  normal.        Behavior: Behavior normal.        Thought Content: Thought content normal.           Assessment & Plan:   Bacterial sinusitis- pt has hx of similar.  Was seen at [facility] on Saturday and started on Augmentin and Predisone.  She is still struggling w/ congestion and cough.  Will add Azelastine nasal spray and Albuterol to her current medications.  Cough meds prn.  Work note provided.  Pt expressed understanding and is in agreement w/ plan.

## 2023-03-30 NOTE — Patient Instructions (Signed)
Follow up as needed or as scheduled CONTINUE the Amoxicillin and Prednisone as directed CONTINUE the Singulair, Nasonex, Xyzal ADD the Albuterol inhaler for cough/wheezing until feeling better ADD the Azelastine nasal spray twice daily until feeling better Drink lots of fluids REST!!! Call with any questions or concerns Hang in there!!

## 2023-04-02 ENCOUNTER — Telehealth: Payer: Self-pay | Admitting: Family Medicine

## 2023-04-02 NOTE — Telephone Encounter (Signed)
Caller name: Mikeria Valin  On DPR?: Yes  Call back number: [phone] (mobile)  Provider they see: Sheliah Hatch, MD  Reason for call:  On Saturday went Urgent Care Amoxicillin and Prednisone then pt came here [facility] on Monday. After this morning last took Prednisone but Amoxicillin.  Question do you want to continue because has enough ABX until Tuesday. Pt feels a lot better nasal/chest congestion, lost taste and smell.

## 2023-04-02 NOTE — Telephone Encounter (Signed)
She should finish the Amoxicillin as directed

## 2023-04-02 NOTE — Telephone Encounter (Signed)
Pt was advised notes her congestion is worse and mucous is yellow, noted some change in taste and smell advised a home COVID will call back if needed

## 2023-04-08 ENCOUNTER — Ambulatory Visit: Payer: BC Managed Care – PPO | Admitting: Family Medicine

## 2023-04-08 ENCOUNTER — Encounter: Payer: Self-pay | Admitting: Family Medicine

## 2023-04-08 VITALS — BP 130/84 | HR 88 | Temp 97.8°F | Resp 18 | Ht 65.0 in | Wt 192.1 lb

## 2023-04-08 DIAGNOSIS — J4541 Moderate persistent asthma with (acute) exacerbation: Secondary | ICD-10-CM | POA: Diagnosis not present

## 2023-04-08 MED ORDER — PREDNISONE 10 MG PO TABS: ORAL_TABLET | ORAL | 0 refills | Status: DC

## 2023-04-08 MED ORDER — PROMETHAZINE-DM 6.25-15 MG/5ML PO SYRP: 5.0000 mL | ORAL_SOLUTION | Freq: Four times a day (QID) | ORAL | 0 refills | Status: DC | PRN

## 2023-04-08 NOTE — Progress Notes (Signed)
   Subjective:    Patient ID: Crystal Sims, female    DOB: October 16, 1981, 42 y.o.   MRN: [id number]  HPI Cough- pt was seen 4/29 and dx'd w/ sinus infxn.  Tx'd w/ Augmentin and Prednisone.  Pt reports feeling better but continues to have productive cough and nasal drainage.  Taking Delsym w/o relief.  Pt had 2 episodes of bronchospasm that improved w/ inhaler.   Review of Systems For ROS see HPI     Objective:   Physical Exam Vitals reviewed.  Constitutional:      General: She is not in acute distress.    Appearance: Normal appearance. She is well-developed. She is not ill-appearing.  HENT:     Head: Normocephalic and atraumatic.     Right Ear: Tympanic membrane normal.     Left Ear: Tympanic membrane normal.     Nose: Mucosal edema, congestion and rhinorrhea present.     Right Sinus: No maxillary sinus tenderness or frontal sinus tenderness.     Left Sinus: No maxillary sinus tenderness or frontal sinus tenderness.     Mouth/Throat:     Pharynx: Posterior oropharyngeal erythema (w/ PND) present.  Eyes:     Conjunctiva/sclera: Conjunctivae normal.     Pupils: Pupils are equal, round, and reactive to light.  Cardiovascular:     Rate and Rhythm: Normal rate and regular rhythm.     Heart sounds: Normal heart sounds.  Pulmonary:     Effort: Pulmonary effort is normal. No respiratory distress.     Breath sounds: No wheezing or rales.     Comments: Decreased BS throughout Dry cough Musculoskeletal:     Cervical back: Normal range of motion and neck supple.  Lymphadenopathy:     Cervical: No cervical adenopathy.  Skin:    General: Skin is warm and dry.  Neurological:     General: No focal deficit present.     Mental Status: She is alert and oriented to person, place, and time.  Psychiatric:        Mood and Affect: Mood normal.        Behavior: Behavior normal.        Thought Content: Thought content normal.           Assessment & Plan:  RAD- acute exacerbation.  Pt  reports feeling better since finishing antibiotics but continues to have SOB and cough.  On Breo and Albuterol.  Will add Prednisone taper.  Reviewed supportive care and red flags that should prompt return.  Pt expressed understanding and is in agreement w/ plan.

## 2023-04-08 NOTE — Patient Instructions (Signed)
Follow up as needed or as scheduled START the Prednisone as directed- 3 pills at the same time x3 days, then 2 pills at the same time x3 days, then 1 pill daily.  Take w/ food  Drink LOTS of fluids CONTINUE your inhalers and allergy medications as directed USE the cough syrup as needed Call with any questions or concerns Hang in there!!

## 2023-04-09 ENCOUNTER — Ambulatory Visit: Payer: BC Managed Care – PPO | Admitting: Family Medicine

## 2023-04-24 ENCOUNTER — Other Ambulatory Visit: Payer: Self-pay | Admitting: Family Medicine

## 2023-04-30 ENCOUNTER — Ambulatory Visit: Payer: BC Managed Care – PPO | Admitting: Family Medicine

## 2023-04-30 ENCOUNTER — Other Ambulatory Visit: Payer: Self-pay

## 2023-04-30 ENCOUNTER — Telehealth: Payer: Self-pay

## 2023-04-30 VITALS — BP 126/80 | HR 89 | Temp 97.8°F | Resp 18 | Ht 65.0 in | Wt 196.5 lb

## 2023-04-30 DIAGNOSIS — R7989 Other specified abnormal findings of blood chemistry: Secondary | ICD-10-CM

## 2023-04-30 DIAGNOSIS — R002 Palpitations: Secondary | ICD-10-CM

## 2023-04-30 DIAGNOSIS — R0602 Shortness of breath: Secondary | ICD-10-CM

## 2023-04-30 LAB — BASIC METABOLIC PANEL
BUN: 9 mg/dL (ref 6–23)
CO2: 28 mEq/L (ref 19–32)
Calcium: 9.5 mg/dL (ref 8.4–10.5)
Chloride: 102 mEq/L (ref 96–112)
Creatinine, Ser: 0.67 mg/dL (ref 0.40–1.20)
GFR: 107.8 mL/min (ref >60.00)
Glucose, Bld: 104 mg/dL — ABNORMAL HIGH (ref 70–99)
Potassium: 4.1 mEq/L (ref 3.5–5.1)
Sodium: 139 mEq/L (ref 135–145)

## 2023-04-30 LAB — CBC WITH DIFFERENTIAL/PLATELET
Basophils Absolute: 0 10*3/uL (ref 0.0–0.1)
Basophils Relative: 0.5 % (ref 0.0–3.0)
Eosinophils Absolute: 0.1 10*3/uL (ref 0.0–0.7)
Eosinophils Relative: 1.7 % (ref 0.0–5.0)
HCT: 38.5 % (ref 36.0–46.0)
Hemoglobin: 11.9 g/dL — ABNORMAL LOW (ref 12.0–15.0)
Lymphocytes Relative: 38.5 % (ref 12.0–46.0)
Lymphs Abs: 2.1 10*3/uL (ref 0.7–4.0)
MCHC: 31 g/dL (ref 30.0–36.0)
MCV: 84.6 fl (ref 78.0–100.0)
Monocytes Absolute: 0.5 10*3/uL (ref 0.1–1.0)
Monocytes Relative: 9.6 % (ref 3.0–12.0)
Neutro Abs: 2.8 10*3/uL (ref 1.4–7.7)
Neutrophils Relative %: 49.7 % (ref 43.0–77.0)
Platelets: 389 10*3/uL (ref 150.0–400.0)
RBC: 4.54 Mil/uL (ref 3.87–5.11)
RDW: 19.8 % — ABNORMAL HIGH (ref 11.5–15.5)
WBC: 5.6 10*3/uL (ref 4.0–10.5)

## 2023-04-30 LAB — HEPATIC FUNCTION PANEL
ALT: 41 U/L — ABNORMAL HIGH (ref 0–35)
AST: 29 U/L (ref 0–37)
Albumin: 4.2 g/dL (ref 3.5–5.2)
Alkaline Phosphatase: 64 U/L (ref 39–117)
Bilirubin, Direct: 0.1 mg/dL (ref 0.0–0.3)
Total Bilirubin: 0.3 mg/dL (ref 0.2–1.2)
Total Protein: 7.6 g/dL (ref 6.0–8.3)

## 2023-04-30 LAB — TSH: TSH: 0.87 u[IU]/mL (ref 0.35–5.50)

## 2023-04-30 NOTE — Telephone Encounter (Signed)
-----   Message from Sheliah Hatch, MD sent at 04/30/2023 12:57 PM EDT ----- Labs look great w/ exception of mildly elevated ALT (liver enzyme).  Please hold tylenol and alcohol x2 weeks and we will repeat your liver functions at a lab only visit (dx elevated LFTs)

## 2023-04-30 NOTE — Telephone Encounter (Signed)
Spoke to pt and informed her of lab results . Lab only visit has been made and repeat liver function order is in

## 2023-04-30 NOTE — Patient Instructions (Signed)
Follow up as needed or as scheduled We'll notify you of your lab results and make any changes if needed Continue your inhalers as directed- Breo daily and albuterol as needed We'll call you to schedule your pulmonary appt Call with any questions or concerns Hang in there!!!

## 2023-04-30 NOTE — Progress Notes (Signed)
   Subjective:    Patient ID: Crystal Sims, female    DOB: Oct 27, 1981, 42 y.o.   MRN: [id number]  HPI Cough- pt was treated for sinus infxn on 4/29.  She was seen for ongoing cough on 5/8 and tx'd w/ Prednisone.  Also on Breo, Azelastine nasal spray, Atrovent nasal spray, Xyzal, Nasonex, Singulair, and Albuterol PRN.  Sees Dr [place] Callas at allergy and asthma but has not been seen since April.  Pt reports she will develop episodic shortness of breath and feel like her heart is racing.  After these episodes the cough seems to be more noticeable.  Pt reports that during these episodes she will have to stop what she's doing, rest, use her inhaler, and 'try and control my breathing'.  Has had 2-3 episodes this week.  Thinks episodes possibly improved when she was on Prednisone.  Pt reports SOB is the first thing she notices and then her increased heart rate.  Pt reports this is the most she has ever had to use her inhaler.     Review of Systems For ROS see HPI     Objective:   Physical Exam Vitals reviewed.  Constitutional:      General: She is not in acute distress.    Appearance: Normal appearance. She is well-developed. She is obese. She is not ill-appearing.  HENT:     Head: Normocephalic and atraumatic.  Eyes:     Conjunctiva/sclera: Conjunctivae normal.     Pupils: Pupils are equal, round, and reactive to light.  Neck:     Thyroid: No thyromegaly.  Cardiovascular:     Rate and Rhythm: Normal rate and regular rhythm.     Pulses: Normal pulses.     Heart sounds: Normal heart sounds. No murmur heard. Pulmonary:     Effort: Pulmonary effort is normal. No respiratory distress.     Breath sounds: Normal breath sounds. No wheezing or rhonchi.     Comments: No cough heard Abdominal:     General: There is no distension.     Palpations: Abdomen is soft.     Tenderness: There is no abdominal tenderness.  Musculoskeletal:     Cervical back: Normal range of motion and neck supple.   Lymphadenopathy:     Cervical: No cervical adenopathy.  Skin:    General: Skin is warm and dry.  Neurological:     General: No focal deficit present.     Mental Status: She is alert and oriented to person, place, and time.  Psychiatric:        Mood and Affect: Mood normal.        Behavior: Behavior normal.        Thought Content: Thought content normal.           Assessment & Plan:   SOB- ongoing issue for pt despite URI improving.  Pt reports she will have episodic SOB that then triggers palpitations.  She is sure that the breathing issues precede the palpitations.  She is able to get sxs under control w/ stopping activity, using inhaler, and taking deep breaths.  Has had multiple episodes this week.  She continues to use Breo, Albuterol, Singulair, Xyzal, Nasonex, Singulair, Azelastine, and Atrovent.  Pt reports at last visit Dr [place] Callas referred her to ENT as he had nothing left to offer.  Check labs to r/o metabolic causes of palpitations.  Refer to pulmonary for complete evaluation.  Pt expressed understanding and is in agreement w/ plan.

## 2023-05-14 ENCOUNTER — Other Ambulatory Visit (INDEPENDENT_AMBULATORY_CARE_PROVIDER_SITE_OTHER): Payer: BC Managed Care – PPO

## 2023-05-14 DIAGNOSIS — R7989 Other specified abnormal findings of blood chemistry: Secondary | ICD-10-CM | POA: Diagnosis not present

## 2023-05-14 LAB — HEPATIC FUNCTION PANEL
ALT: 18 U/L (ref 0–35)
AST: 19 U/L (ref 0–37)
Albumin: 3.9 g/dL (ref 3.5–5.2)
Alkaline Phosphatase: 49 U/L (ref 39–117)
Bilirubin, Direct: 0 mg/dL (ref 0.0–0.3)
Total Bilirubin: 0.3 mg/dL (ref 0.2–1.2)
Total Protein: 6.9 g/dL (ref 6.0–8.3)

## 2023-05-15 ENCOUNTER — Telehealth: Payer: Self-pay

## 2023-05-15 NOTE — Telephone Encounter (Signed)
-----   Message from Sheliah Hatch, MD sent at 05/14/2023  8:08 PM EDT ----- Liver functions are now normal- great news!

## 2023-06-29 ENCOUNTER — Ambulatory Visit: Payer: BC Managed Care – PPO | Admitting: Internal Medicine

## 2023-06-29 ENCOUNTER — Encounter: Payer: Self-pay | Admitting: Internal Medicine

## 2023-06-29 VITALS — BP 118/72 | HR 91 | Temp 97.8°F | Ht 66.0 in | Wt 188.2 lb

## 2023-06-29 DIAGNOSIS — J301 Allergic rhinitis due to pollen: Secondary | ICD-10-CM | POA: Diagnosis not present

## 2023-06-29 DIAGNOSIS — J4540 Moderate persistent asthma, uncomplicated: Secondary | ICD-10-CM

## 2023-06-29 DIAGNOSIS — J329 Chronic sinusitis, unspecified: Secondary | ICD-10-CM

## 2023-06-29 LAB — POCT EXHALED NITRIC OXIDE: FeNO level (ppb): 8

## 2023-06-29 MED ORDER — FLUTICASONE-SALMETEROL 250-50 MCG/ACT IN AEPB: 1.0000 | INHALATION_SPRAY | Freq: Two times a day (BID) | RESPIRATORY_TRACT | 5 refills | Status: DC

## 2023-06-29 NOTE — Progress Notes (Signed)
Crystal Sims    [id number]    [birth year]  Primary Care Physician:Tabori, Helane Rima, MD  Referring Physician: Sheliah Hatch, MD [address] [place],  [place] [address] Reason for Consultation: shortness of breath Date of Consultation: 06/29/2023  Chief complaint:   Chief Complaint  Patient presents with   Consult    Hx of SOB in May 2024.  Sx have improved since June 2024.     HPI: Crystal Sims is a 42 y.o. woman who presents for new patient evaluation for shortness of breath. She had a URI in May 2024 with sinus congestion which subsequently went into lower respiratory symptoms with dyspnea. Episodes complicated by tachycardia. Improved with rest.   She had several rounds of prednisone and antibiotics with subsequent improvement.  She did see [facility] Dr. [place] Callas. She does get allergy shots.   She has saw Dr. Suszanne Conners and had turbinade reduction June 20th. She has a history of nasal polyposis.   She would say she's improved somewhat but not back to 100% due to chest tightness and dry cough.    She has a history of childhood sinus disease and asthma. Usually triggered by viral URI. Controlled with prn albuterol.   For sinuses she is on:  Xyzal, singulair, nasonex, atrovent nasal sprays with nasal saline.   Breo and albuterol for asthma - minimal albuterol use. She usually gets prednisone multiple times a year - 2-3 times/year. Never been hospitalized for her breathing. Has been on breo for several years.    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  Exposures: lives at home with mom. No pets.  Smoking history: never smoker, no passive smoke exposure.   Social History   Occupational History   Not on file  Tobacco Use   Smoking status: Never   Smokeless tobacco: Never  Vaping Use   Vaping status: Never Used  Substance and Sexual Activity   Alcohol use: No   Drug use: No   Sexual  activity: Not on file    Relevant family history:  Family History  Problem Relation Age of Onset   Hypertension Mother    Diabetes Mother    Hypertension Father    Kidney disease Father    Diabetes Father    Hypertension Maternal Grandmother    Diabetes Maternal Grandmother    Cancer Maternal Grandfather    Stroke Maternal Grandfather    Hypertension Maternal Grandfather    Diabetes Maternal Grandfather    Arthritis Paternal Grandmother    Hypertension Paternal Grandmother    Diabetes Paternal Grandmother    Hypertension Paternal Grandfather    Diabetes Paternal Grandfather     Past Medical History:  Diagnosis Date   Allergy    Chicken pox    Environmental allergies    Scoliosis    Seizures (HCC) infancy   Noted only occured once with a fever during infancy   Sinusitis    recurrent    No past surgical history on file.   Physical Exam: Blood pressure 118/72, pulse 91, temperature 97.8 F (36.6 C), temperature source Oral, height 5\' 6"  (1.676 m), weight 188 lb 3.2 oz (85.4 kg), SpO2 97%. Gen:      No acute distress ENT:  no nasal polyps, mucus membranes moist, horizontal indentation across nasal bridge Lungs:    No increased respiratory effort, symmetric chest wall excursion, clear to auscultation bilaterally, no wheezes  or crackles CV:         Regular rate and rhythm; no murmurs, rubs, or gallops.  No pedal edema Abd:      + bowel sounds; soft, non-tender; no distension MSK: no acute synovitis of DIP or PIP joints, no mechanics hands.  Skin:      Warm and dry; no rashes Neuro: normal speech, no focal facial asymmetry Psych: alert and oriented x3, normal mood and affect   Data Reviewed/Medical Decision Making:  Independent interpretation of tests: Imaging:  Review of patient's CT sinus march 2024 images revealed no polyps. The patient's images have been independently reviewed by me.      CT Sinus 2016 Polypoid opacification maxillary sinuses bilaterally as  detailed above may represent retention cysts although true polyps not excluded.   Infundibulum patent bilaterally.   Opacification mid to anterior ethmoid sinus air cells with remainder of ethmoid sinus air cells clear.   Posterior medial right sphenoid sinus mucosal thickening measuring up to 4 mm. Left sphenoid sinus is clear.   Frontal sinuses are clear.   Visualized mastoid air cells and middle ear cavities are clear. The bony cover of the superior semi circular canals is markedly thin or possibly dehiscent (may be an incidental finding although has been described in patients with vertigo).   Labs:  Lab Results  Component Value Date   NA 139 04/30/2023   K 4.1 04/30/2023   CO2 28 04/30/2023   GLUCOSE 104 (H) 04/30/2023   BUN 9 04/30/2023   CREATININE 0.67 04/30/2023   CALCIUM 9.5 04/30/2023   GFR 107.80 04/30/2023   Lab Results  Component Value Date   WBC 5.6 04/30/2023   HGB 11.9 (L) 04/30/2023   HCT 38.5 04/30/2023   MCV 84.6 04/30/2023   PLT 389.0 04/30/2023      Immunization status:  Immunization History  Administered Date(s) Administered   Influenza Split 09/02/2012   Influenza, High Dose Seasonal PF 11/12/2018, 09/20/2021   Influenza,inj,Quad PF,6+ Mos 09/21/2018, 11/20/2020   Moderna Sars-Covid-2 Vaccination 03/19/2020, 08/15/2020, 08/07/2021   Unspecified SARS-COV-2 Vaccination 02/20/2020, 03/19/2020     I reviewed prior external note(s) from pcp  I reviewed the result(s) of the labs and imaging as noted above.   I have ordered feno   Assessment:  Moderate persistent asthma not well controlled with frequent oral glucocorticoids Chronic Rhinosinusitis s/p turbinade hypertrophy reduction.    Plan/Recommendations:  Feno Obtained today 8 ppb  Please continue all your asthma treatments  For sinus issues: Xyzal, singulair, nasonex, atrovent nasal sprays with nasal saline.   For asthma: continue albuterol as needed. We will go up on your  inhaler from [facility] to Advair 1 puff in the morning, 1 puff at night, gargle after use.   Call me sooner if any issues or concerns with your breathing.   Consider biologic therapy for asthma if not well controlled. She would be a good candidate.    We discussed disease management and progression at length today.    Return to Care: Return in about 3 months (around 09/29/2023).  Durel Salts, MD Pulmonary and Critical Care Medicine [facility] Office:[phone]  CC: Sheliah Hatch, MD

## 2023-06-29 NOTE — Patient Instructions (Addendum)
Please schedule follow up scheduled with myself in 3 months.  If my schedule is not open yet, we will contact you with a reminder closer to that time. Please call [phone] if you haven't heard from [place] a month before.   Please continue all your asthma treatments  For sinus issues: Xyzal, singulair, nasonex, atrovent nasal sprays with nasal saline.   For asthma: continue albuterol as needed. We will go up on your inhaler from [facility] to Advair 1 puff in the morning, 1 puff at night, gargle after use.   Call me sooner if any issues or concerns with your breathing.    By learning about asthma and how it can be controlled, you take an important step toward managing this disease. Work closely with your asthma care team to learn all you can about your asthma, how to avoid triggers, what your medications do, and how to take them correctly. With proper care, you can live free of asthma symptoms and maintain a normal, healthy lifestyle.   What is asthma? Asthma is a chronic disease that affects the airways of the lungs. During normal breathing, the bands of muscle that surround the airways are relaxed and air moves freely. During an asthma episode or "attack," there are three main changes that stop air from moving easily through the airways: The bands of muscle that surround the airways tighten and make the airways narrow. This tightening is called bronchospasm.  The lining of the airways becomes swollen or inflamed.  The cells that line the airways produce more mucus, which is thicker than normal and clogs the airways.  These three factors - bronchospasm, inflammation, and mucus production - cause symptoms such as difficulty breathing, wheezing, and coughing.  What are the most common symptoms of asthma? Asthma symptoms are not the same for everyone. They can even change from episode to episode in the same person. Also, you may have only one symptom of asthma, such as cough, but another person may  have all the symptoms of asthma. It is important to know all the symptoms of asthma and to be aware that your asthma can present in any of these ways at any time. The most common symptoms include: Coughing, especially at night  Shortness of breath  Wheezing  Chest tightness, pain, or pressure   Who is affected by asthma? Asthma affects 22 million Americans; about 6 million of these are children under age 19. People who have a family history of asthma have an increased risk of developing the disease. Asthma is also more common in people who have allergies or who are exposed to tobacco smoke. However, anyone can develop asthma at any time. Some people may have asthma all of their lives, while others may develop it as adults.  What causes asthma? The airways in a person with asthma are very sensitive and react to many things, or "triggers." Contact with these triggers causes asthma symptoms. One of the most important parts of asthma control is to identify your triggers and then avoid them when possible. The only trigger you do not want to avoid is exercise. Pre-treatment with medicines before exercise can allow you to stay active yet avoid asthma symptoms. Common asthma triggers include: Infections (colds, viruses, flu, sinus infections)  Exercise  Weather (changes in temperature and/or humidity, cold air)  Tobacco smoke  Allergens (dust mites, pollens, pets, mold spores, cockroaches, and sometimes foods)  Irritants (strong odors from cleaning products, perfume, wood smoke, air pollution)  Strong emotions  such as crying or laughing hard  Some medications   How is asthma diagnosed? To diagnose asthma, your doctor will first review your medical history, family history, and symptoms. Your doctor will want to know any past history of breathing problems you may have had, as well as a family history of asthma, allergies, eczema (a bumpy, itchy skin rash caused by allergies), or other lung disease. It  is important that you describe your symptoms in detail (cough, wheeze, shortness of breath, chest tightness), including when and how often they occur. The doctor will perform a physical examination and listen to your heart and lungs. He or she may also order breathing tests, allergy tests, blood tests, and chest and sinus X-rays. The tests will find out if you do have asthma and if there are any other conditions that are contributing factors.  How is asthma treated? Asthma can be controlled, but not cured. It is not normal to have frequent symptoms, trouble sleeping, or trouble completing tasks. Appropriate asthma care will prevent symptoms and visits to the emergency room and hospital. Asthma medicines are one of the mainstays of asthma treatment. The drugs used to treat asthma are explained below.  Anti-inflammatories: These are the most important drugs for most people with asthma. Anti-inflammatory drugs reduce swelling and mucus production in the airways. As a result, airways are less sensitive and less likely to react to triggers. These medications need to be taken daily and may need to be taken for several weeks before they begin to control asthma. Anti-inflammatory medicines lead to fewer symptoms, better airflow, less sensitive airways, less airway damage, and fewer asthma attacks. If taken every day, they CONTROL or prevent asthma symptoms.   Bronchodilators: These drugs relax the muscle bands that tighten around the airways. This action opens the airways, letting more air in and out of the lungs and improving breathing. Bronchodilators also help clear mucus from the lungs. As the airways open, the mucus moves more freely and can be coughed out more easily. In short-acting forms, bronchodilators RELIEVE or stop asthma symptoms by quickly opening the airways and are very helpful during an asthma episode. In long-acting forms, bronchodilators provide CONTROL of asthma symptoms and prevent asthma  episodes.  Asthma drugs can be taken in a variety of ways. Inhaling the medications by using a metered dose inhaler, dry powder inhaler, or nebulizer is one way of taking asthma medicines. Oral medicines (pills or liquids you swallow) may also be prescribed.  Asthma severity Asthma is classified as either "intermittent" (comes and goes) or "persistent" (lasting). Persistent asthma is further described as being mild, moderate, or severe. The severity of asthma is based on how often you have symptoms both during the day and night, as well as by the results of lung function tests and by how well you can perform activities. The "severity" of asthma refers to how "intense" or "strong" your asthma is.  Asthma control Asthma control is the goal of asthma treatment. Regardless of your asthma severity, it may or may not be controlled. Asthma control means: You are able to do everything you want to do at work and home  You have no (or minimal) asthma symptoms  You do not wake up from your sleep or earlier than usual in the morning due to asthma  You rarely need to use your reliever medicine (inhaler)  Another major part of your treatment is that you are happy with your asthma care and believe your asthma is controlled.  Monitoring symptoms A key part of treatment is keeping track of how well your lungs are working. Monitoring your symptoms, what they are, how and when they happen, and how severe they are, is an important part of being able to control your asthma.  Sometimes asthma is monitored using a peak flow meter. A peak flow (PF) meter measures how fast the air comes out of your lungs. It can help you know when your asthma is getting worse, sometimes even before you have symptoms. By taking daily peak flow readings, you can learn when to adjust medications to keep asthma under good control. It is also used to create your asthma action plan (see below). Your doctor can use your peak flow readings to adjust  your treatment plan in some cases.  Asthma Action Plan Based on your history and asthma severity, you and your doctor will develop a care plan called an "asthma action plan." The asthma action plan describes when and how to use your medicines, actions to take when asthma worsens, and when to seek emergency care. Make sure you understand this plan. If you do not, ask your asthma care provider any questions you may have. Your asthma action plan is one of the keys to controlling asthma. Keep it readily available to remind you of what you need to do every day to control asthma and what you need to do when symptoms occur.  Goals of asthma therapy These are the goals of asthma treatment: Live an active, normal life  Prevent chronic and troublesome symptoms  Attend work or school every day  Perform daily activities without difficulty  Stop urgent visits to the doctor, emergency department, or hospital  Use and adjust medications to control asthma with few or no side effects

## 2023-07-15 ENCOUNTER — Encounter: Payer: Self-pay | Admitting: Family Medicine

## 2023-07-15 ENCOUNTER — Ambulatory Visit: Payer: BC Managed Care – PPO | Admitting: Family Medicine

## 2023-07-15 VITALS — BP 128/84 | HR 76 | Temp 97.8°F | Resp 18 | Ht 66.0 in | Wt 186.0 lb

## 2023-07-15 DIAGNOSIS — R0982 Postnasal drip: Secondary | ICD-10-CM | POA: Diagnosis not present

## 2023-07-15 NOTE — Progress Notes (Signed)
   Subjective:    Patient ID: Crystal Sims, female    DOB: June 30, 1981, 42 y.o.   MRN: [id number]  HPI PND- sxs started last week.  Having to clear throat regularly.  Started Tylenol and Mucinex.  Currently on Astelin, Nasonex, Singulair, Xyzal.  Pt is getting allergy shots weekly.     Review of Systems For ROS see HPI     Objective:   Physical Exam Vitals reviewed.  Constitutional:      General: She is not in acute distress.    Appearance: Normal appearance. She is well-developed. She is not ill-appearing.  HENT:     Head: Normocephalic and atraumatic.     Right Ear: Tympanic membrane and ear canal normal.     Left Ear: Tympanic membrane and ear canal normal.     Nose: Mucosal edema and congestion present. No rhinorrhea.     Right Sinus: No maxillary sinus tenderness or frontal sinus tenderness.     Left Sinus: No maxillary sinus tenderness or frontal sinus tenderness.     Comments: No TTP over frontal or maxillary sinuses    Mouth/Throat:     Pharynx: Posterior oropharyngeal erythema (w/ PND) present.  Eyes:     Conjunctiva/sclera: Conjunctivae normal.     Pupils: Pupils are equal, round, and reactive to light.  Cardiovascular:     Rate and Rhythm: Normal rate and regular rhythm.     Heart sounds: Normal heart sounds.  Pulmonary:     Effort: Pulmonary effort is normal. No respiratory distress.     Breath sounds: Normal breath sounds. No wheezing or rales.  Musculoskeletal:     Cervical back: Normal range of motion and neck supple.  Lymphadenopathy:     Cervical: No cervical adenopathy.  Skin:    General: Skin is warm and dry.  Neurological:     General: No focal deficit present.     Mental Status: She is alert and oriented to person, place, and time.  Psychiatric:        Mood and Affect: Mood normal.        Behavior: Behavior normal.        Thought Content: Thought content normal.           Assessment & Plan:  PND- ongoing issue for pt.  On multiple allergy  medications and getting shots regularly.  Encouraged increased fluids, addition of nasal saline, and continued use of Mucinex.  Reviewed supportive care and red flags that should prompt return.  Pt expressed understanding and is in agreement w/ plan.

## 2023-07-15 NOTE — Patient Instructions (Signed)
Follow up as needed CONTINUE your allergy medication daily ADD the Mucinex Drink LOTS of fluids Add a nasal saline rinse to help w/ the pollen Call with any questions or concerns Stay Safe!  Stay Healthy!! Sherri Rad in there!!!

## 2023-08-12 LAB — HM MAMMOGRAPHY

## 2023-08-13 LAB — HM PAP SMEAR: HPV, high-risk: NEGATIVE

## 2023-09-30 ENCOUNTER — Encounter: Payer: Self-pay | Admitting: Dermatology

## 2023-09-30 ENCOUNTER — Ambulatory Visit: Payer: BC Managed Care – PPO | Admitting: Dermatology

## 2023-09-30 VITALS — BP 121/79 | HR 90

## 2023-09-30 DIAGNOSIS — L219 Seborrheic dermatitis, unspecified: Secondary | ICD-10-CM

## 2023-09-30 MED ORDER — CLOBETASOL PROPIONATE 0.05 % EX SOLN: 1.0000 | Freq: Two times a day (BID) | CUTANEOUS | 4 refills | Status: DC

## 2023-09-30 NOTE — Progress Notes (Signed)
   New Patient Visit   Subjective  Crystal Sims is a 42 y.o. female who presents for the following: Scalp Concerns  Patient states she has excessive Dryness located at the scalp that she would like to have examined. Patient reports the areas have been there for 1 month. She reports the areas are bothersome. She states the area will itch if she doesn't wash within a week. Patient rates irritation 4 out of 10. She states that the areas have spread. Patient reports she has not previously been treated for these areas. Patient reports she has hx of eczema.  The following portions of the chart were reviewed this encounter and updated as appropriate: medications, allergies, medical history  Review of Systems:  No other skin or systemic complaints except as noted in HPI or Assessment and Plan.  Objective  Well appearing patient in no apparent distress; mood and affect are within normal limits.  A focused examination was performed of the following areas: Scalp  Relevant exam findings are noted in the Assessment and Plan.    Assessment & Plan   SEBORRHEIC DERMATITIS Exam: Pink patches with greasy scale at scalp  Flared  Seborrheic Dermatitis is a chronic persistent rash characterized by pinkness and scaling most commonly of the mid face but also can occur on the scalp (dandruff), ears; mid chest, mid back and groin.  It tends to be exacerbated by stress and cooler weather.  People who have neurologic disease may experience new onset or exacerbation of existing seborrheic dermatitis.  The condition is not curable but treatable and can be controlled.  Treatment Plan: - We will plan to prescribe clobetasol drops to apply directly to the effected areas on scalp - Recommended washing hair with DHS Zinc shampoo, allowing it sit on the scalp for 2-3 minutes weekly, then apply hydrating shampoo, rinse and then conditioner - Explained apply oil directly to hair but not scalp to help prevent flares -  We will plan to see you PRN Seborrheic dermatitis  Related Medications clobetasol (TEMOVATE) 0.05 % external solution Apply 1 Application topically 2 (two) times daily.    No follow-ups on file.    Documentation: I have reviewed the above documentation for accuracy and completeness, and I agree with the above.  Stasia Cavalier, am acting as scribe for Langston Reusing, DO.  Langston Reusing, DO

## 2023-09-30 NOTE — Patient Instructions (Addendum)
Hello Crystal Sims,  Thank you for visiting my office today. Your dedication to improving your scalp health is greatly appreciated. Below is a summary of the essential instructions we covered during our consultation:  Diagnosis: Seborrheic Dermatitis - Shampoo Recommendations:   - DHS Zinc Shampoo: Use once a week, specifically targeting areas with thicker buildup. Allow it to sit for 2-3 minutes before rinsing thoroughly.   - Combination Use: After applying DHS Zinc, mix with your regular hydrating shampoo (e.g., Shea Moisture, Pattern, Olaplex) for optimal results.   - Scalp Care: It's important to avoid applying oils directly to your scalp as this can exacerbate your condition. Oil treatments should be limited to the hair only.  - Medication:   - Clobetasol Drops: For immediate relief from itching, flaking, or irritation, apply to the affected areas as needed. Your prescription, along with four refills, will be sent to your pharmacy.  - Follow-Up Appointment:   - We will schedule a follow-up visit in three months to evaluate your progress and make any necessary adjustments to your treatment plan.  Additionally, educational materials and pictures of the recommended shampoo will be sent to you for your reference.  Thank you again for your visit today. I am looking forward to our next appointment to continue advancing your care plan.  Warm regards,  Dr. [facility] Re[facility]         Important Information   Due to recent changes in healthcare laws, you may see results of your pathology and/or laboratory studies on MyChart before the doctors have had a chance to review them. We understand that in some cases there may be results that are confusing or concerning to you. Please understand that not all results are received at the same time and often the doctors may need to interpret multiple results in order to provide you with the best plan of care or course of treatment. Therefore, we  ask that you please give [place] 2 business days to thoroughly review all your results before contacting the office for clarification. Should we see a critical lab result, you will be contacted sooner.     If You Need Anything After Your Visit   If you have any questions or concerns for your doctor, please call our main line at [phone]. If no one answers, please leave a voicemail as directed and we will return your call as soon as possible. Messages left after 4 pm will be answered the following business day.    You may also send [place] a message via MyChart. We typically respond to MyChart messages within 1-2 business days.  For prescription refills, please ask your pharmacy to contact our office. Our fax number is [phone].  If you have an urgent issue when the clinic is closed that cannot wait until the next business day, you can page your doctor at the number below.     Please note that while we do our best to be available for urgent issues outside of office hours, we are not available 24/7.    If you have an urgent issue and are unable to reach [place], you may choose to seek medical care at your doctor's office, retail clinic, urgent care center, or emergency room.   If you have a medical emergency, please immediately call 911 or go to the emergency department. In the event of inclement weather, please call our main line at [phone] for an update on the status of any delays or closures.  Dermatology Medication Tips: Please  keep the boxes that topical medications come in in order to help keep track of the instructions about where and how to use these. Pharmacies typically print the medication instructions only on the boxes and not directly on the medication tubes.   If your medication is too expensive, please contact our office at [phone] or send [place] a message through MyChart.    We are unable to tell what your co-pay for medications will be in advance as this is different depending  on your insurance coverage. However, we may be able to find a substitute medication at lower cost or fill out paperwork to get insurance to cover a needed medication.    If a prior authorization is required to get your medication covered by your insurance company, please allow [place] 1-2 business days to complete this process.   Drug prices often vary depending on where the prescription is filled and some pharmacies may offer cheaper prices.   The website www.goodrx.com contains coupons for medications through different pharmacies. The prices here do not account for what the cost may be with help from insurance (it may be cheaper with your insurance), but the website can give you the price if you did not use any insurance.  - You can print the associated coupon and take it with your prescription to the pharmacy.  - You may also stop by our office during regular business hours and pick up a GoodRx coupon card.  - If you need your prescription sent electronically to a different pharmacy, notify our office through [facility] or by phone at [phone]

## 2023-10-19 ENCOUNTER — Encounter: Payer: Self-pay | Admitting: Dermatology

## 2023-10-19 ENCOUNTER — Ambulatory Visit: Payer: BC Managed Care – PPO | Admitting: Dermatology

## 2023-10-19 DIAGNOSIS — L709 Acne, unspecified: Secondary | ICD-10-CM | POA: Diagnosis not present

## 2023-10-19 DIAGNOSIS — L700 Acne vulgaris: Secondary | ICD-10-CM

## 2023-10-19 DIAGNOSIS — L308 Other specified dermatitis: Secondary | ICD-10-CM

## 2023-10-19 DIAGNOSIS — L989 Disorder of the skin and subcutaneous tissue, unspecified: Secondary | ICD-10-CM | POA: Diagnosis not present

## 2023-10-19 DIAGNOSIS — L409 Psoriasis, unspecified: Secondary | ICD-10-CM

## 2023-10-19 MED ORDER — ZORYVE 0.3 % EX CREA: TOPICAL_CREAM | CUTANEOUS | 1 refills | Status: DC

## 2023-10-19 MED ORDER — TRETINOIN 0.025 % EX CREA: TOPICAL_CREAM | Freq: Every day | CUTANEOUS | 1 refills | Status: DC

## 2023-10-19 MED ORDER — SPIRONOLACTONE 100 MG PO TABS: 100.0000 mg | ORAL_TABLET | Freq: Every day | ORAL | 2 refills | Status: DC

## 2023-10-19 NOTE — Patient Instructions (Addendum)
Hello Crystal Sims,  Thank you for visiting [place] today. We appreciate your commitment to improving your health and effectively managing your skin conditions.  Here is a summary of the key instructions from today's consultation:  - Medications:   - Spironolactone: Continue using Spironolactone 100 mg for acne control.   - Tretinoin Cream: Start using Tretinoin cream for acne and anti-aging. Apply a pea-sized amount two nights a week (e.g., Monday and Thursday).   - Zoryve Cream: Switch from Triamcinolone to Zoryve cream for psoriasis. Apply once daily. This will be sent to [facility].  - Skincare Routine:   - Moisturizers: Use La Roche-Posay Double Repair Toleriane and CeraVe light gel moisturizers as instructed, especially on nights when applying Tretinoin.   - Itch Relief: Consider using CeraVe anti-itch lotion with pyridoxine for itch relief, available in the medicated section.  - General Advice:   - Hydration: Ensure adequate hydration, especially since Spironolactone is a diuretic.   - Winter Care: Use heavier moisturizers during winter to manage eczema and prevent dryness.  - Follow-Up:   - We will see you again in eight weeks to assess the effectiveness of Zoryve and your overall skin condition.  Please feel free to reach out via MyChart if you have any questions or encounter any issues with your pharmacy. We look forward to seeing your progress and are here to support you every step of the way.  Warm regards,  Dr. [facility] Reusing [facility]      Important Information  Due to recent changes in healthcare laws, you may see results of your pathology and/or laboratory studies on MyChart before the doctors have had a chance to review them. We understand that in some cases there may be results that are confusing or concerning to you. Please understand that not all results are received at the same time and often the doctors may need to interpret multiple results in order to  provide you with the best plan of care or course of treatment. Therefore, we ask that you please give [place] 2 business days to thoroughly review all your results before contacting the office for clarification. Should we see a critical lab result, you will be contacted sooner.   If You Need Anything After Your Visit  If you have any questions or concerns for your doctor, please call our main line at [phone] If no one answers, please leave a voicemail as directed and we will return your call as soon as possible. Messages left after 4 pm will be answered the following business day.   You may also send [place] a message via MyChart. We typically respond to MyChart messages within 1-2 business days.  For prescription refills, please ask your pharmacy to contact our office. Our fax number is [phone].  If you have an urgent issue when the clinic is closed that cannot wait until the next business day, you can page your doctor at the number below.    Please note that while we do our best to be available for urgent issues outside of office hours, we are not available 24/7.   If you have an urgent issue and are unable to reach [place], you may choose to seek medical care at your doctor's office, retail clinic, urgent care center, or emergency room.  If you have a medical emergency, please immediately call 911 or go to the emergency department. In the event of inclement weather, please call our main line at [phone] for an update on the status of any  delays or closures.  Dermatology Medication Tips: Please keep the boxes that topical medications come in in order to help keep track of the instructions about where and how to use these. Pharmacies typically print the medication instructions only on the boxes and not directly on the medication tubes.   If your medication is too expensive, please contact our office at [phone] or send [place] a message through MyChart.   We are unable to tell what your  co-pay for medications will be in advance as this is different depending on your insurance coverage. However, we may be able to find a substitute medication at lower cost or fill out paperwork to get insurance to cover a needed medication.   If a prior authorization is required to get your medication covered by your insurance company, please allow [place] 1-2 business days to complete this process.  Drug prices often vary depending on where the prescription is filled and some pharmacies may offer cheaper prices.  The website www.goodrx.com contains coupons for medications through different pharmacies. The prices here do not account for what the cost may be with help from insurance (it may be cheaper with your insurance), but the website can give you the price if you did not use any insurance.  - You can print the associated coupon and take it with your prescription to the pharmacy.  - You may also stop by our office during regular business hours and pick up a GoodRx coupon card.  - If you need your prescription sent electronically to a different pharmacy, notify our office through [facility] or by phone at [phone]

## 2023-10-19 NOTE — Progress Notes (Signed)
   Follow-Up Visit   Subjective  Crystal Sims is a 42 y.o. female who presents for the following: Acne - Eczema  Patient present today for follow up visit for new concerns. Patient was last evaluated on 09/30/23 for [facility]. Today she has a complaint of facial acne and eczema located on the back and neck. She was Rx Triamcinolone 1% and Spirolactone 100mg  by her previous dermatologist that worked well but she needs refills. Patient reports sxs are unchanged. Patient denies medication changes.  The following portions of the chart were this encounter and updated as appropriate: medications, allergies, medical history  Review of Systems:  No other skin or systemic complaints except as noted in HPI or Assessment and Plan.  Objective  Well appearing patient in no apparent distress; mood and affect are within normal limits.   A focused examination was performed of the following areas: face & back   Relevant exam findings are noted in the Assessment and Plan.    Assessment & Plan   Acne - Assessment: stable, no active flare today - Plan:  -Refilling spironolactone 100 mg daily for hormonal acne control without side effects. -Initiate tretinoin cream for acne and anti-aging. Instruct patient on application two nights a week (e.g., Monday and Thursday) post face wash and moisturizer use. Emphasize using a pea-sized amount for application, followed by a second moisturizer layer to minimize dryness and irritation. Encourage increased water intake to counteract spironolactone's diuretic effect.   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 - Plan: Advised discontinuation of triamcinolone cream due to potential for skin addiction and adverse effects.  Start Zoryve Cream Daily. Monitor response to Zoryve cream treatment.  Prescription to be sent to [facility] with notes on previous treatments. Recommend daily use of CeraVe  anti-itch lotion with pyridoxine for itch relief.   Skincare and Moisturization - Assessment: Not specified. - Plan: Provide samples of La Roche-Posay Double Repair Toleriane for enhanced moisturization, instructing patient on twice-daily use. Recommend layering CeraVe light gel under La Roche-Posay moisturizer for optimal hydration. On tretinoin application nights, advise using CeraVe light gel, then tretinoin, followed by CeraVe moisturizer.   ACNE VULGARIS Exam: Open comedones and inflammatory papules  Stable  Treatment Plan: - Refills of Spirolactone 100mg  sent to pharmacy - Rx Tretinoin 0.025% cream to use 2x/night. Use pea size amount. Apply moisturizer after. - Samples of La Roche Posay double moisturizer w/ Toleriane provided.        No follow-ups on file.    Documentation: I have reviewed the above documentation for accuracy and completeness, and I agree with the above.   I, Shirron Marcha Solders, CMA, am acting as scribe for [employer], DO.   Langston Reusing, DO

## 2023-11-04 ENCOUNTER — Telehealth: Payer: Self-pay

## 2023-11-04 NOTE — Telephone Encounter (Signed)
PA initiated CMM Key: [id number]   PA approved Per CMM:  Approved today by [facility] NCPDP 2017 Your PA request has been approved. Additional information will be provided in the approval communication. (Message 1145) Authorization Expiration Date: 02/02/2024

## 2023-11-17 ENCOUNTER — Other Ambulatory Visit: Payer: Self-pay | Admitting: Dermatology

## 2023-12-14 ENCOUNTER — Ambulatory Visit: Payer: 59 | Admitting: Dermatology

## 2023-12-14 ENCOUNTER — Encounter: Payer: Self-pay | Admitting: Dermatology

## 2023-12-14 DIAGNOSIS — L819 Disorder of pigmentation, unspecified: Secondary | ICD-10-CM | POA: Diagnosis not present

## 2023-12-14 DIAGNOSIS — L409 Psoriasis, unspecified: Secondary | ICD-10-CM

## 2023-12-14 DIAGNOSIS — L810 Postinflammatory hyperpigmentation: Secondary | ICD-10-CM

## 2023-12-14 MED ORDER — SAFETY SEAL MISCELLANEOUS MISC: 0 refills | Status: DC

## 2023-12-14 NOTE — Patient Instructions (Addendum)
 Hello Crystal Sims,  Thank you for visiting our clinic today. We appreciate your dedication to enhancing your skin health. Below is a summary of the essential instructions from today's consultation:  Zoryve  Application: Continue applying Zoryve  daily as it is deemed safe for everyday use.  Prescribed Lightening Cream: Start using the prescribed lightening cream that contains tranexamic acid, kojic acid, vitamin C, resveratrol, and niacinamide. This should be applied over your entire back.   Mixing Instructions: Combine a small amount of the medication with a similar amount of moisturizer or Zoryve .   Application Assistance: It is recommended to have someone help you massage this mixture into your skin on your back.   Expected Improvement: Significant improvement is anticipated within 4 months.  Sun Protection: Ensure to protect your skin with sunscreen, especially when wearing outfits that expose your back, to maintain the results and prevent further darkening.  Progress Monitoring: Baseline pictures have been taken today to monitor the progress.  Prescription Details: The prescription has been sent to [facility], a set designer. They will reach out to confirm details and mail the medication to you. The cost is approximately $45.  Managing Side Effects: If you experience dryness or irritation, consider adjusting the application of tretinoin  to every other night. You may also mix it with Zerif lotion if preferred.  Please adhere to these instructions carefully and do not hesitate to contact our office if you have any questions or require further assistance.  Warm regards,  Dr. Delon Lenis [facility]     Important Information  Due to recent changes in healthcare laws, you may see results of your pathology and/or laboratory studies on MyChart before the doctors have had a chance to review them. We understand that in some cases there may be results that are confusing or  concerning to you. Please understand that not all results are received at the same time and often the doctors may need to interpret multiple results in order to provide you with the best plan of care or course of treatment. Therefore, we ask that you please give us  2 business days to thoroughly review all your results before contacting the office for clarification. Should we see a critical lab result, you will be contacted sooner.   If You Need Anything After Your Visit  If you have any questions or concerns for your doctor, please call our main line at [phone] If no one answers, please leave a voicemail as directed and we will return your call as soon as possible. Messages left after 4 pm will be answered the following business day.   You may also send us  a message via MyChart. We typically respond to MyChart messages within 1-2 business days.  For prescription refills, please ask your pharmacy to contact our office. Our fax number is [phone].  If you have an urgent issue when the clinic is closed that cannot wait until the next business day, you can page your doctor at the number below.    Please note that while we do our best to be available for urgent issues outside of office hours, we are not available 24/7.   If you have an urgent issue and are unable to reach us , you may choose to seek medical care at your doctor's office, retail clinic, urgent care center, or emergency room.  If you have a medical emergency, please immediately call 911 or go to the emergency department. In the event of inclement weather, please call our main line at [phone] for  an update on the status of any delays or closures.  Dermatology Medication Tips: Please keep the boxes that topical medications come in in order to help keep track of the instructions about where and how to use these. Pharmacies typically print the medication instructions only on the boxes and not directly on the medication tubes.    If your medication is too expensive, please contact our office at [phone] or send us  a message through MyChart.   We are unable to tell what your co-pay for medications will be in advance as this is different depending on your insurance coverage. However, we may be able to find a substitute medication at lower cost or fill out paperwork to get insurance to cover a needed medication.   If a prior authorization is required to get your medication covered by your insurance company, please allow us  1-2 business days to complete this process.  Drug prices often vary depending on where the prescription is filled and some pharmacies may offer cheaper prices.  The website www.goodrx.com contains coupons for medications through different pharmacies. The prices here do not account for what the cost may be with help from insurance (it may be cheaper with your insurance), but the website can give you the price if you did not use any insurance.  - You can print the associated coupon and take it with your prescription to the pharmacy.  - You may also stop by our office during regular business hours and pick up a GoodRx coupon card.  - If you need your prescription sent electronically to a different pharmacy, notify our office through [facility] or by phone at [phone]

## 2023-12-14 NOTE — Progress Notes (Signed)
   Follow-Up Visit   Subjective  Crystal Sims is a 43 y.o. female who presents for the following: F/U - Psoriasis  Patient present today for follow up visit for psoriasis. Patient was last evaluated on 10/19/23 & Rx Zoryve  cream to use daily & recommended to use CeraVe anti itch moisturizer. Patient reports sxs are better. Patient denies medication changes. However, she is concerned about scarring in the areas on her back.   The following portions of the chart were reviewed this encounter and updated as appropriate: medications, allergies, medical history  Review of Systems:  No other skin or systemic complaints except as noted in HPI or Assessment and Plan.  Objective  Well appearing patient in no apparent distress; mood and affect are within normal limits.   A focused examination was performed of the following areas: abdomen & back   Relevant exam findings are noted in the Assessment and Plan.    Assessment & Plan    Psoriasis (Stable) with Secondary Hyperpigmentation of the Back Assessment: Patient is currently using Zoryve  (roflumilast ) cream for an unspecified skin condition, likely hyperpigmentation on the back. The clinician is introducing a new treatment regimen to address the hyperpigmentation more effectively.  Plan:   Prescribe compounded lightening cream containing tranexamic acid, kojic acid, vitamin C, resveratrol, and niacinamide.   Instruct patient to apply the lightening cream mixed with moisturizer over the entire back daily.    Advise the patient to expect visible improvement in approximately 4 months.   Recommend daily sunscreen use, especially during warmer months.   Obtain baseline photographs.   Follow up in 4 months (May).   Note: Prescription to be filled by [facility] compounding pharmacy, approximate cost $40.   No follow-ups on file.    Documentation: I have reviewed the above documentation for accuracy and completeness, and I agree with the  above.  I, Shirron Maranda, CMA, am acting as scribe for [employer], DO.    Delon Lenis, DO

## 2023-12-22 ENCOUNTER — Telehealth (INDEPENDENT_AMBULATORY_CARE_PROVIDER_SITE_OTHER): Payer: Self-pay | Admitting: Otolaryngology

## 2023-12-22 NOTE — Telephone Encounter (Signed)
Confirmed appt & location [id number]

## 2023-12-23 ENCOUNTER — Ambulatory Visit (INDEPENDENT_AMBULATORY_CARE_PROVIDER_SITE_OTHER): Payer: 59 | Admitting: Otolaryngology

## 2023-12-23 ENCOUNTER — Encounter (INDEPENDENT_AMBULATORY_CARE_PROVIDER_SITE_OTHER): Payer: Self-pay

## 2023-12-23 VITALS — BP 137/88 | HR 72 | Ht 65.0 in | Wt 179.0 lb

## 2023-12-23 DIAGNOSIS — J310 Chronic rhinitis: Secondary | ICD-10-CM

## 2023-12-23 DIAGNOSIS — R0981 Nasal congestion: Secondary | ICD-10-CM | POA: Diagnosis not present

## 2023-12-23 DIAGNOSIS — R0982 Postnasal drip: Secondary | ICD-10-CM | POA: Diagnosis not present

## 2023-12-23 MED ORDER — IPRATROPIUM BROMIDE 0.06 % NA SOLN: 2.0000 | Freq: Two times a day (BID) | NASAL | 12 refills | Status: AC | PRN

## 2023-12-23 NOTE — Progress Notes (Unsigned)
Patient ID: Crystal Sims, female   DOB: 1981/03/29, 43 y.o.   MRN: [id number]  Follow-up: Chronic nasal congestion  HPI: The patient is a 43 year old female who returns today for her follow-up evaluation.  The patient was previously seen for chronic nasal congestion.  She was noted to have nasal mucosal congestion and bilateral inferior turbinate hypertrophy.  She underwent bilateral turbinate reduction surgery in June 2024.  The patient returns today reporting significant improvement in her nasal breathing.  She has not had any sinusitis since the surgery.  She has noted occasional postnasal drainage, especially during the allergy season.  Currently she denies any facial pain or fever.  Exam: General: Communicates without difficulty, well nourished, no acute distress. Head: Normocephalic, no evidence injury, no tenderness, facial buttresses intact without stepoff. Face/sinus: No tenderness to palpation and percussion. Facial movement is normal and symmetric. Eyes: PERRL, EOMI. No scleral icterus, conjunctivae clear. Neuro: CN II exam reveals vision grossly intact.  No nystagmus at any point of gaze. Ears: Auricles well formed without lesions.  Ear canals are intact without mass or lesion.  No erythema or edema is appreciated.  The TMs are intact without fluid. Nose: External evaluation reveals normal support and skin without lesions.  Dorsum is intact.  Anterior rhinoscopy reveals mildly congested mucosa over anterior aspect of inferior turbinates and intact septum.  No purulence noted. Oral:  Oral cavity and oropharynx are intact, symmetric, without erythema or edema.  Mucosa is moist without lesions. Neck: Full range of motion without pain.  There is no significant lymphadenopathy.  No masses palpable.  Thyroid bed within normal limits to palpation.  Parotid glands and submandibular glands equal bilaterally without mass.  Trachea is midline. Neuro:  CN 2-12 grossly intact.   Assessment: 1.  Chronic  rhinitis with mild nasal mucosal congestion. 2.  Her turbinates are well-healed.  Her nasal passageways are patent bilaterally. 3.  Chronic postnasal drainage.  Plan: 1.  The physical exam findings are reviewed with the patient. 2.  Atrovent nasal spray to treat her chronic postnasal drainage.  A refill for Atrovent is given to the patient. 3.  The patient is encouraged to call with any questions or concerns.

## 2024-01-06 ENCOUNTER — Ambulatory Visit (INDEPENDENT_AMBULATORY_CARE_PROVIDER_SITE_OTHER): Payer: 59 | Admitting: Dermatology

## 2024-01-06 ENCOUNTER — Encounter: Payer: Self-pay | Admitting: Dermatology

## 2024-01-06 DIAGNOSIS — L700 Acne vulgaris: Secondary | ICD-10-CM

## 2024-01-06 DIAGNOSIS — L219 Seborrheic dermatitis, unspecified: Secondary | ICD-10-CM

## 2024-01-06 DIAGNOSIS — L810 Postinflammatory hyperpigmentation: Secondary | ICD-10-CM

## 2024-01-06 DIAGNOSIS — L309 Dermatitis, unspecified: Secondary | ICD-10-CM | POA: Diagnosis not present

## 2024-01-06 MED ORDER — TRETINOIN 0.05 % EX CREA: TOPICAL_CREAM | CUTANEOUS | 2 refills | Status: DC

## 2024-01-06 NOTE — Patient Instructions (Addendum)
 Hello Crystal Sims,  Thank you for visiting us  today.   Here is a summary of the key instructions from today's consultation:  Scalp Care: Continue using DHS-Zinc and clobetasol  solution as needed. No refills required for clobetasol  at this time.  Upper Back: Continue applying Zoryve  cream cream daily.  Try stopping after 2 months.  Facial Treatment:   Routine: Continue using La Roche-Posay products for face wash and moisturization.   Tretinoin  Adjustment: Increase tretinoin  to 0.5 strength, starting with 2 nights a week. If skin becomes too dry or irritated, reduce to once a week. Aim to increase usage to three nights a week by summer.   Application: Ensure to apply tretinoin  to the entire face for both acne management and anti-aging benefits. Follow up with moisturizer.  Medications:   Spironolactone : Maintain current dosage of 100 mg.   Tretinoin  Prescription: A new prescription for the increased strength of tretinoin  will be sent.  Follow-Up Appointment: Schedule a follow-up visit in July to monitor progress and adjust treatment as necessary.  Thank you once again for your dedication to your health. Please do not hesitate to contact us  if you have any questions or need further assistance.  Warm regards,  Dr. Delon [facility]is [facility]      Important Information  Due to recent changes in healthcare laws, you may see results of your pathology and/or laboratory studies on MyChart before the doctors have had a chance to review them. We understand that in some cases there may be results that are confusing or concerning to you. Please understand that not all results are received at the same time and often the doctors may need to interpret multiple results in order to provide you with the best plan of care or course of treatment. Therefore, we ask that you please give us  2 business days to thoroughly review all your results before contacting the office for clarification. Should we see a  critical lab result, you will be contacted sooner.   If You Need Anything After Your Visit  If you have any questions or concerns for your doctor, please call our main line at [phone] If no one answers, please leave a voicemail as directed and we will return your call as soon as possible. Messages left after 4 pm will be answered the following business day.   You may also send us  a message via MyChart. We typically respond to MyChart messages within 1-2 business days.  For prescription refills, please ask your pharmacy to contact our office. Our fax number is [phone].  If you have an urgent issue when the clinic is closed that cannot wait until the next business day, you can page your doctor at the number below.    Please note that while we do our best to be available for urgent issues outside of office hours, we are not available 24/7.   If you have an urgent issue and are unable to reach us , you may choose to seek medical care at your doctor's office, retail clinic, urgent care center, or emergency room.  If you have a medical emergency, please immediately call 911 or go to the emergency department. In the event of inclement weather, please call our main line at [phone] for an update on the status of any delays or closures.  Dermatology Medication Tips: Please keep the boxes that topical medications come in in order to help keep track of the instructions about where and how to use these. Pharmacies typically print the medication instructions  only on the boxes and not directly on the medication tubes.   If your medication is too expensive, please contact our office at [phone] or send us  a message through MyChart.   We are unable to tell what your co-pay for medications will be in advance as this is different depending on your insurance coverage. However, we may be able to find a substitute medication at lower cost or fill out paperwork to get insurance to cover a needed  medication.   If a prior authorization is required to get your medication covered by your insurance company, please allow us  1-2 business days to complete this process.  Drug prices often vary depending on where the prescription is filled and some pharmacies may offer cheaper prices.  The website www.goodrx.com contains coupons for medications through different pharmacies. The prices here do not account for what the cost may be with help from insurance (it may be cheaper with your insurance), but the website can give you the price if you did not use any insurance.  - You can print the associated coupon and take it with your prescription to the pharmacy.  - You may also stop by our office during regular business hours and pick up a GoodRx coupon card.  - If you need your prescription sent electronically to a different pharmacy, notify our office through [facility] or by phone at [phone]

## 2024-01-06 NOTE — Progress Notes (Signed)
   Follow-Up Visit   Subjective  Crystal Sims is a 43 y.o. female who presents for the following: Acne  Patient present today for follow up visit. Patient was last evaluated on 12/14/23 for psoriasis. Patient reports sxs are better but she is still experiencing breakouts at the jaw line. Using tretinoin  2-3x/week at night. Patient denies medication changes.  The following portions of the chart were reviewed this encounter and updated as appropriate: medications, allergies, medical history   Review of Systems:  No other skin or systemic complaints except as noted in HPI or Assessment and Plan.  Objective  Well appearing patient in no apparent distress; mood and affect are within normal limits.   A focused examination was performed of the following areas: face   Relevant exam findings are noted in the Assessment and Plan.             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 Plaques on the upper back are improving but leaving behind hyperpigmentation and present with a fine scale that darkens the skin. The condition was described as possibly guttate psoriasis or dermatitis, with inflammation causing the dark spots.  Plan:   Discontinue Zoryve  and observe for recurrence.   Use a good moisturizer on affected areas.   If the condition returns, restart Zoryve .   Continue Zoryve  daily for the trunk for two more months, then attempt to discontinue.   Observe the back for continued improvement.  Seborrheic Dermatitis Assessment: Patient is using DHS-Zinc and clobetasol  solution for scalp condition.  Scalp is much improved and pt is happy with results  Plan:   Continue DHS-Zinc.   Use clobetasol  solution as needed.   No refills required at this time.  Acne/Facial Skin Concerns Assessment: Patient reports  improvement in facial skin condition but still has a small bump on the chin. Currently on spironolactone  100mg  and using tretinoin , which has been helping. The goal is for completely smooth skin.  Plan:   Continue spironolactone  100mg .   Increase tretinoin  strength to 0.5%.   Start tretinoin  2 nights per week, increasing to 3 nights per week by April/May.   Apply tretinoin  to the entire face.   Continue using La Roche-Posay face wash and moisturizer.   Follow up in July for reassessment.      No follow-ups on file.    Documentation: I have reviewed the above documentation for accuracy and completeness, and I agree with the above.  I, Shirron Maranda, CMA, am acting as scribe for [employer], DO.   Delon Lenis, DO

## 2024-01-13 ENCOUNTER — Other Ambulatory Visit: Payer: Self-pay | Admitting: Dermatology

## 2024-01-13 DIAGNOSIS — L700 Acne vulgaris: Secondary | ICD-10-CM

## 2024-02-02 ENCOUNTER — Other Ambulatory Visit: Payer: Self-pay | Admitting: Dermatology

## 2024-02-02 DIAGNOSIS — L409 Psoriasis, unspecified: Secondary | ICD-10-CM

## 2024-02-02 MED ORDER — ZORYVE 0.3 % EX CREA: TOPICAL_CREAM | CUTANEOUS | 4 refills | Status: AC

## 2024-02-02 MED ORDER — SAFETY SEAL MISCELLANEOUS MISC: 5 refills | Status: AC

## 2024-02-02 NOTE — Progress Notes (Signed)
 Pt called needing refills.

## 2024-02-02 NOTE — Progress Notes (Signed)
 Pt needed refills.

## 2024-02-22 ENCOUNTER — Other Ambulatory Visit: Payer: Self-pay | Admitting: Family Medicine

## 2024-04-14 ENCOUNTER — Ambulatory Visit: Payer: 59 | Admitting: Dermatology

## 2024-04-17 ENCOUNTER — Other Ambulatory Visit: Payer: Self-pay | Admitting: Internal Medicine

## 2024-05-10 ENCOUNTER — Ambulatory Visit (INDEPENDENT_AMBULATORY_CARE_PROVIDER_SITE_OTHER): Admitting: Family Medicine

## 2024-05-10 ENCOUNTER — Encounter: Payer: Self-pay | Admitting: Family Medicine

## 2024-05-10 VITALS — BP 118/76 | HR 87 | Temp 98.0°F

## 2024-05-10 DIAGNOSIS — J310 Chronic rhinitis: Secondary | ICD-10-CM | POA: Diagnosis not present

## 2024-05-10 MED ORDER — PREDNISONE 10 MG PO TABS: ORAL_TABLET | ORAL | 0 refills | Status: DC

## 2024-05-10 NOTE — Progress Notes (Signed)
   Subjective:    Patient ID: Crystal Sims, female    DOB: Apr 20, 1981, 43 y.o.   MRN: [id number]  HPI Nasal congestion- pt reports this is ongoing for her.  She denies feeling badly.  Nasal drainage is clear.  Has some mild maxillary pressure.  Has tried Mucinex  DM and sxs resolved but returned once she stopped medication.  Added Sudafed w/o relief.  Also on Azelastine  and Nasonex , Singulair, Xyzal.  Sxs are pretty much year round but worse at change of season.  Currently on allergy injxns.     Review of Systems For ROS see HPI     Objective:   Physical Exam Vitals reviewed.  Constitutional:      General: She is not in acute distress.    Appearance: Normal appearance. She is well-developed. She is not ill-appearing.  HENT:     Head: Normocephalic and atraumatic.     Right Ear: Tympanic membrane normal.     Left Ear: Tympanic membrane normal.     Nose: Mucosal edema, congestion and rhinorrhea present.     Right Sinus: No maxillary sinus tenderness or frontal sinus tenderness.     Left Sinus: No maxillary sinus tenderness or frontal sinus tenderness.     Mouth/Throat:     Pharynx: Posterior oropharyngeal erythema (w/ PND) present.  Eyes:     Conjunctiva/sclera: Conjunctivae normal.     Pupils: Pupils are equal, round, and reactive to light.  Cardiovascular:     Rate and Rhythm: Normal rate and regular rhythm.     Heart sounds: Normal heart sounds.  Pulmonary:     Effort: Pulmonary effort is normal. No respiratory distress.     Breath sounds: Normal breath sounds. No wheezing or rales.  Musculoskeletal:     Cervical back: Normal range of motion and neck supple.  Lymphadenopathy:     Cervical: No cervical adenopathy.  Skin:    General: Skin is warm and dry.  Neurological:     General: No focal deficit present.     Mental Status: She is alert and oriented to person, place, and time.  Psychiatric:        Mood and Affect: Mood normal.        Behavior: Behavior normal.         Thought Content: Thought content normal.           Assessment & Plan:

## 2024-05-10 NOTE — Patient Instructions (Signed)
 Follow up as needed or as scheduled START the Prednisone  as directed- 3 pills at the same time x3 days, then 2 pills at the same time x3 days, then 1 pill daily.  Take w/ food  Continue your allergy shots and medications- you're doing all the right things! Call with any questions or concerns Stay Safe!  Stay Healthy! Have a great summer!!

## 2024-05-10 NOTE — Assessment & Plan Note (Signed)
 Deteriorated.  Pt already getting allergy shots, on 2 nasal sprays and on daily antihistamine and leukotriene inhibitor.  Follows w/ allergist.  Will add low dose prednisone  taper to improve sxs.  Pt expressed understanding and is in agreement w/ plan.

## 2024-06-14 ENCOUNTER — Ambulatory Visit: Payer: 59 | Admitting: Dermatology

## 2024-07-22 ENCOUNTER — Ambulatory Visit: Admitting: Family Medicine

## 2024-07-22 ENCOUNTER — Encounter: Payer: Self-pay | Admitting: Family Medicine

## 2024-07-22 ENCOUNTER — Other Ambulatory Visit: Payer: Self-pay | Admitting: Dermatology

## 2024-07-22 VITALS — BP 118/72 | HR 84 | Temp 98.1°F | Resp 20 | Ht 65.0 in | Wt 193.4 lb

## 2024-07-22 DIAGNOSIS — J329 Chronic sinusitis, unspecified: Secondary | ICD-10-CM | POA: Diagnosis not present

## 2024-07-22 DIAGNOSIS — B9689 Other specified bacterial agents as the cause of diseases classified elsewhere: Secondary | ICD-10-CM

## 2024-07-22 DIAGNOSIS — L700 Acne vulgaris: Secondary | ICD-10-CM

## 2024-07-22 MED ORDER — AMOXICILLIN 875 MG PO TABS: 875.0000 mg | ORAL_TABLET | Freq: Two times a day (BID) | ORAL | 0 refills | Status: AC

## 2024-07-22 NOTE — Progress Notes (Signed)
   Subjective:    Patient ID: Crystal Sims, female    DOB: 12-24-1980, 43 y.o.   MRN: [id number]  HPI URI- pt reports a lot of sinus pain and pressure.  Sxs started last week.  Started to improve and then suddenly worsened on Wednesday.  + congestion.  + tooth pain.  No fever.  No body aches.     Review of Systems For ROS see HPI     Objective:   Physical Exam Vitals reviewed.  Constitutional:      General: She is not in acute distress.    Appearance: Normal appearance. She is well-developed.  HENT:     Head: Normocephalic and atraumatic.     Right Ear: Tympanic membrane normal.     Left Ear: Tympanic membrane normal.     Nose: Mucosal edema and congestion present. No rhinorrhea.     Right Sinus: Maxillary sinus tenderness and frontal sinus tenderness present.     Left Sinus: Maxillary sinus tenderness and frontal sinus tenderness present.     Mouth/Throat:     Pharynx: Uvula midline. Posterior oropharyngeal erythema present. No oropharyngeal exudate.  Eyes:     Conjunctiva/sclera: Conjunctivae normal.     Pupils: Pupils are equal, round, and reactive to light.  Cardiovascular:     Rate and Rhythm: Normal rate and regular rhythm.     Heart sounds: Normal heart sounds.  Pulmonary:     Effort: Pulmonary effort is normal. No respiratory distress.     Breath sounds: Normal breath sounds. No wheezing.  Musculoskeletal:     Cervical back: Normal range of motion and neck supple.  Lymphadenopathy:     Cervical: No cervical adenopathy.  Skin:    General: Skin is warm and dry.  Neurological:     General: No focal deficit present.     Mental Status: She is alert and oriented to person, place, and time.     Cranial Nerves: No cranial nerve deficit.     Motor: No weakness.     Coordination: Coordination normal.  Psychiatric:        Mood and Affect: Mood normal.        Behavior: Behavior normal.        Thought Content: Thought content normal.           Assessment & Plan:   Bacterial sinusitis- pt has hx of similar.  Start high dose Amox.  Reviewed supportive care and red flags that should prompt return.  Pt expressed understanding and is in agreement w/ plan.

## 2024-07-22 NOTE — Patient Instructions (Signed)
 Follow up as needed or as scheduled START the Amoxicillin  twice daily Drink lots of fluids REST!! Call with any questions or concerns Hang in there!!!

## 2024-07-25 ENCOUNTER — Other Ambulatory Visit: Payer: Self-pay

## 2024-07-25 NOTE — Progress Notes (Signed)
 Error

## 2024-08-17 ENCOUNTER — Other Ambulatory Visit: Payer: Self-pay | Admitting: Internal Medicine

## 2024-08-17 NOTE — Telephone Encounter (Signed)
 Copied from CRM #[id number]. Topic: Clinical - Medication Refill >> Aug 17, 2024  8:54 AM Isabell A wrote: Medication: fluticasone -salmeterol (WIXELA INHUB) 250-50 MCG/ACT AEPB  Has the patient contacted their pharmacy? Yes (Agent: If no, request that the patient contact the pharmacy for the refill. If patient does not wish to contact the pharmacy document the reason why and proceed with request.) (Agent: If yes, when and what did the pharmacy advise?)  This is the patient's preferred pharmacy:  [id number] [place], [place] - [address] [place]  [address] Phone: [phone] Fax: [phone]  Is this the correct pharmacy for this prescription? Yes If no, delete pharmacy and type the correct one.   Has the prescription been filled recently? Yes  Is the patient out of the medication? Yes  Has the patient been seen for an appointment in the last year OR does the patient have an upcoming appointment? Yes  Can we respond through MyChart? No  Agent: Please be advised that Rx refills may take up to 3 business days. We ask that you follow-up with your pharmacy.

## 2024-10-03 ENCOUNTER — Ambulatory Visit: Admitting: Dermatology

## 2024-10-05 ENCOUNTER — Ambulatory Visit: Admitting: Dermatology

## 2024-10-05 ENCOUNTER — Encounter: Payer: Self-pay | Admitting: Dermatology

## 2024-10-05 DIAGNOSIS — L400 Psoriasis vulgaris: Secondary | ICD-10-CM

## 2024-10-05 DIAGNOSIS — L409 Psoriasis, unspecified: Secondary | ICD-10-CM

## 2024-10-05 DIAGNOSIS — L700 Acne vulgaris: Secondary | ICD-10-CM | POA: Diagnosis not present

## 2024-10-05 NOTE — Progress Notes (Signed)
   Follow-Up Visit   Subjective  Crystal Sims is a 43 y.o. female established patient who presents for FOLLOW UP on the diagnoses listed below:  Patient (and/or pt guardian) consented to the use of AI-assisted tools for note generation.  Patient was last evaluated on 01/06/24.    Acne: Continue spironolactone  100mg . Increase tretinoin  strength to 0.5%. Start tretinoin  2 nights per week, increasing to 3 nights per week by April/May. Pt she has remained at applying Tretinoin  0.5% 2 nights a week as increasing to 3 nights caused dryness.   Psoriasis: Discontinue Zoryve  and observe for recurrence. Use a good moisturizer on affected areas. If the condition returns, restart Zoryve . Pt had to restart Zorvye in June/July applying to trouble areas at the spine once weekly for itch that she rated 4 out of 10.    Are you nursing, pregnant or trying to conceive? No   The following portions of the chart were reviewed this encounter and updated as appropriate: medications, allergies, medical history  Review of Systems:  No other skin or systemic complaints except as noted in HPI or Assessment and Plan.  Objective  Well appearing patient in no apparent distress; mood and affect are within normal limits.   A focused examination was performed of the following areas: face & back   Relevant exam findings are noted in the Assessment and Plan.             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 Itching is rated 2/10. Zoryve  is effective and non-steroidal, allowing for safe use during flares. - Apply Zoryve  once daily for two weeks during flares. - After two weeks, if itching improves, continue daily application until symptoms resolve. - Once symptoms resolve, apply Zoryve  twice weekly to affected areas. - Use La Roche-Posay moisturizer for skin hydration and exfoliation.  Acne vulgaris Spironolactone  reduces oil  gland activity, and tretinoin  promotes skin cell turnover and collagen stimulation. Occasional breakouts are managed with benzoyl peroxide. Avene Sickle Fate cream is recommended for nighttime use to enhance tretinoin  tolerance. - Continue spironolactone  100 mg daily. - Apply tretinoin  0.05% two nights a week. - Use benzoyl peroxide (Clearasil) for spot treatment in the morning. - Use Avene Sickle Fate cream nightly, especially in winter, to improve tretinoin  tolerance. - Adjust tretinoin  frequency to once a week if skin becomes excessively dry in cold weather.     Return in about 1 year (around 10/05/2025) for ACNE, PSORIASIS.   Documentation: I have reviewed the above documentation for accuracy and completeness, and I agree with the above.  I, Shirron Maranda, CMA, am acting as scribe for [employer], DO.   Delon Lenis, DO

## 2024-10-05 NOTE — Patient Instructions (Addendum)
 VISIT SUMMARY:  Today, you had a follow-up appointment to manage your psoriasis and acne. We discussed your current treatment plans and made some adjustments to help improve your symptoms.  YOUR PLAN:  -PSORIASIS VULGARIS:  Psoriasis is a skin condition that causes red, itchy, and scaly patches.  You will continue using Zoryve , applying it once daily for two weeks during flares. If the itching improves, continue daily application until symptoms resolve.  Once symptoms resolve, apply Zoryve  twice weekly to the affected areas. Additionally, use La Roche-Posay moisturizer to keep your skin hydrated and exfoliated.  -ACNE VULGARIS:  Acne is a skin condition that occurs when hair follicles become clogged with oil and dead skin cells.  You will continue taking spironolactone  100 mg daily and applying tretinoin  0.05% two nights a week.  For spot treatment, use benzoyl peroxide (Clearasil) in the morning.  To improve your skin's tolerance to tretinoin , use Avene Cicalfate cream nightly, especially in winter. If your skin becomes excessively dry in cold weather, adjust the tretinoin  frequency to once a week.  INSTRUCTIONS:  Please follow up with us  if you experience any worsening of symptoms or if you have any questions about your treatment plan.    Important Information  Due to recent changes in healthcare laws, you may see results of your pathology and/or laboratory studies on MyChart before the doctors have had a chance to review them. We understand that in some cases there may be results that are confusing or concerning to you. Please understand that not all results are received at the same time and often the doctors may need to interpret multiple results in order to provide you with the best plan of care or course of treatment. Therefore, we ask that you please give us  2 business days to thoroughly review all your results before contacting the office for clarification. Should we see a critical  lab result, you will be contacted sooner.   If You Need Anything After Your Visit  If you have any questions or concerns for your doctor, please call our main line at [phone] If no one answers, please leave a voicemail as directed and we will return your call as soon as possible. Messages left after 4 pm will be answered the following business day.   You may also send us  a message via MyChart. We typically respond to MyChart messages within 1-2 business days.  For prescription refills, please ask your pharmacy to contact our office. Our fax number is [phone].  If you have an urgent issue when the clinic is closed that cannot wait until the next business day, you can page your doctor at the number below.    Please note that while we do our best to be available for urgent issues outside of office hours, we are not available 24/7.   If you have an urgent issue and are unable to reach us , you may choose to seek medical care at your doctor's office, retail clinic, urgent care center, or emergency room.  If you have a medical emergency, please immediately call 911 or go to the emergency department. In the event of inclement weather, please call our main line at [phone] for an update on the status of any delays or closures.  Dermatology Medication Tips: Please keep the boxes that topical medications come in in order to help keep track of the instructions about where and how to use these. Pharmacies typically print the medication instructions only on the boxes and not directly on  the medication tubes.   If your medication is too expensive, please contact our office at [phone] or send us  a message through MyChart.   We are unable to tell what your co-pay for medications will be in advance as this is different depending on your insurance coverage. However, we may be able to find a substitute medication at lower cost or fill out paperwork to get insurance to cover a needed  medication.   If a prior authorization is required to get your medication covered by your insurance company, please allow us  1-2 business days to complete this process.  Drug prices often vary depending on where the prescription is filled and some pharmacies may offer cheaper prices.  The website www.goodrx.com contains coupons for medications through different pharmacies. The prices here do not account for what the cost may be with help from insurance (it may be cheaper with your insurance), but the website can give you the price if you did not use any insurance.  - You can print the associated coupon and take it with your prescription to the pharmacy.  - You may also stop by our office during regular business hours and pick up a GoodRx coupon card.  - If you need your prescription sent electronically to a different pharmacy, notify our office through [facility] or by phone at [phone]

## 2024-10-14 ENCOUNTER — Other Ambulatory Visit: Payer: Self-pay | Admitting: Dermatology

## 2024-12-04 ENCOUNTER — Other Ambulatory Visit: Payer: Self-pay | Admitting: Dermatology

## 2024-12-04 DIAGNOSIS — L219 Seborrheic dermatitis, unspecified: Secondary | ICD-10-CM

## 2024-12-12 ENCOUNTER — Ambulatory Visit: Admitting: Family Medicine

## 2024-12-12 VITALS — BP 98/56 | HR 89 | Ht 65.0 in | Wt 198.5 lb

## 2024-12-12 DIAGNOSIS — J0140 Acute pansinusitis, unspecified: Secondary | ICD-10-CM | POA: Diagnosis not present

## 2024-12-12 MED ORDER — PREDNISONE 10 MG PO TABS: ORAL_TABLET | ORAL | 0 refills | Status: DC

## 2024-12-12 MED ORDER — FLUTICASONE-SALMETEROL 250-50 MCG/ACT IN AEPB: 1.0000 | INHALATION_SPRAY | Freq: Two times a day (BID) | RESPIRATORY_TRACT | 3 refills | Status: AC

## 2024-12-12 NOTE — Progress Notes (Signed)
" ° °  Subjective:    Patient ID: Crystal Sims, female    DOB: [birth year], 44 y.o.   MRN: [id number]  HPI Sinus pressure- sxs started 1 week ago.  + frontal HA, sinus pain over frontal and maxillary sinuses, congestion, PND.  Has a cough productive of yellow sputum.  No fever.  No ear pain.  No tooth pain. + sick contacts.     Review of Systems For ROS see HPI     Objective:   Physical Exam Vitals reviewed.  Constitutional:      General: She is not in acute distress.    Appearance: Normal appearance. She is not ill-appearing.  HENT:     Head: Normocephalic and atraumatic.     Right Ear: Tympanic membrane and ear canal normal.     Left Ear: Tympanic membrane and ear canal normal.     Nose: Congestion present.     Comments: No TTP over frontal or maxillary sinuses    Mouth/Throat:     Mouth: Mucous membranes are moist.     Pharynx: No oropharyngeal exudate or posterior oropharyngeal erythema.  Cardiovascular:     Rate and Rhythm: Normal rate and regular rhythm.  Pulmonary:     Effort: Pulmonary effort is normal. No respiratory distress.     Breath sounds: No wheezing.     Comments: + dry cough Musculoskeletal:     Cervical back: Neck supple.  Lymphadenopathy:     Cervical: No cervical adenopathy.  Skin:    General: Skin is warm and dry.  Neurological:     General: No focal deficit present.     Mental Status: She is alert and oriented to person, place, and time.  Psychiatric:        Mood and Affect: Mood normal.        Behavior: Behavior normal.        Thought Content: Thought content normal.           Assessment & Plan:  Sinusitis- new.  No evidence of bacterial infxn but pt's sinuses are inflamed.  Start Prednisone  taper.  Reviewed supportive care and red flags that should prompt return.  Pt expressed understanding and is in agreement w/ plan.   "

## 2024-12-12 NOTE — Patient Instructions (Signed)
 Follow up as needed or as scheduled START the Prednisone  as directed- 3 pills at the same time x3 days, then 2 pills at the same time x3 days, then 1 pill daily.  Take w/ food  Drink LOTS of fluids Mucinex  will help thin your congestion Call with any questions or concerns Hang in there! HAPPY NEW YEAR!!

## 2024-12-14 ENCOUNTER — Ambulatory Visit: Admitting: Family Medicine

## 2024-12-15 ENCOUNTER — Encounter: Payer: Self-pay | Admitting: Family Medicine

## 2024-12-15 ENCOUNTER — Ambulatory Visit (INDEPENDENT_AMBULATORY_CARE_PROVIDER_SITE_OTHER): Admitting: Family Medicine

## 2024-12-15 VITALS — BP 104/70 | HR 103 | Temp 98.0°F | Ht 65.0 in | Wt 202.0 lb

## 2024-12-15 DIAGNOSIS — J329 Chronic sinusitis, unspecified: Secondary | ICD-10-CM | POA: Diagnosis not present

## 2024-12-15 DIAGNOSIS — B9689 Other specified bacterial agents as the cause of diseases classified elsewhere: Secondary | ICD-10-CM | POA: Diagnosis not present

## 2024-12-15 MED ORDER — PROMETHAZINE-DM 6.25-15 MG/5ML PO SYRP: 5.0000 mL | ORAL_SOLUTION | Freq: Four times a day (QID) | ORAL | 0 refills | Status: AC | PRN

## 2024-12-15 MED ORDER — DOXYCYCLINE HYCLATE 100 MG PO TABS: 100.0000 mg | ORAL_TABLET | Freq: Two times a day (BID) | ORAL | 0 refills | Status: AC

## 2024-12-15 NOTE — Patient Instructions (Signed)
 Follow up as needed or as scheduled FINISH the Prednisone  as directed START the Doxycycline  twice daily w/ food Drink LOTS of fluids USE the cough syrup as needed- may cause some drowsiness Mucinex  DM or Robitussin for daytime cough Call with any questions or concerns Hang in there!

## 2024-12-15 NOTE — Progress Notes (Signed)
" ° °  Subjective:    Patient ID: Crystal Sims, female    DOB: 17-Jun-1981, 44 y.o.   MRN: [id number]  HPI Cough- was seen Monday for similar sxs and started on Prednisone .  Pt feels that sxs are worsening.  Continues to have productive cough.  Now having chest tightness when coughing.  Maxillary sinus pressure is worsening.     Review of Systems For ROS see HPI     Objective:   Physical Exam Vitals reviewed.  Constitutional:      General: She is not in acute distress.    Appearance: Normal appearance. She is well-developed.  HENT:     Head: Normocephalic and atraumatic.     Right Ear: Tympanic membrane normal.     Left Ear: Tympanic membrane normal.     Nose: Mucosal edema and congestion present. No rhinorrhea.     Right Sinus: Maxillary sinus tenderness present. No frontal sinus tenderness.     Left Sinus: Maxillary sinus tenderness present. No frontal sinus tenderness.     Mouth/Throat:     Pharynx: Uvula midline. Posterior oropharyngeal erythema present. No oropharyngeal exudate.  Eyes:     Conjunctiva/sclera: Conjunctivae normal.     Pupils: Pupils are equal, round, and reactive to light.  Cardiovascular:     Rate and Rhythm: Normal rate and regular rhythm.     Heart sounds: Normal heart sounds.  Pulmonary:     Effort: Pulmonary effort is normal. No respiratory distress.     Breath sounds: Normal breath sounds. No wheezing.     Comments: + hacking cough Musculoskeletal:     Cervical back: Normal range of motion and neck supple.  Lymphadenopathy:     Cervical: No cervical adenopathy.  Skin:    General: Skin is warm and dry.  Neurological:     General: No focal deficit present.     Mental Status: She is alert and oriented to person, place, and time.     Cranial Nerves: No cranial nerve deficit.     Motor: No weakness.     Coordination: Coordination normal.  Psychiatric:        Mood and Affect: Mood normal.        Behavior: Behavior normal.        Thought Content:  Thought content normal.           Assessment & Plan:  Bacterial sinusitis- new.  Pt's sxs were improving w/ 2 days or Prednisone  but yesterday again worsened.  Start abx.  Cough meds prn.  Reviewed supportive care and red flags that should prompt return.  Pt expressed understanding and is in agreement w/ plan.  "

## 2024-12-22 ENCOUNTER — Ambulatory Visit: Payer: Self-pay

## 2024-12-22 ENCOUNTER — Encounter: Payer: Self-pay | Admitting: Family Medicine

## 2024-12-22 ENCOUNTER — Ambulatory Visit: Admitting: Family Medicine

## 2024-12-22 VITALS — BP 114/72 | HR 108 | Temp 98.0°F | Ht 65.0 in | Wt 205.4 lb

## 2024-12-22 DIAGNOSIS — J3081 Allergic rhinitis due to animal (cat) (dog) hair and dander: Secondary | ICD-10-CM

## 2024-12-22 DIAGNOSIS — R0602 Shortness of breath: Secondary | ICD-10-CM | POA: Diagnosis not present

## 2024-12-22 MED ORDER — ALBUTEROL SULFATE HFA 108 (90 BASE) MCG/ACT IN AERS: 2.0000 | INHALATION_SPRAY | Freq: Four times a day (QID) | RESPIRATORY_TRACT | 0 refills | Status: AC | PRN

## 2024-12-22 MED ORDER — PREDNISONE 10 MG PO TABS: ORAL_TABLET | ORAL | 0 refills | Status: AC

## 2024-12-22 MED ORDER — ALBUTEROL SULFATE (2.5 MG/3ML) 0.083% IN NEBU
2.5000 mg | INHALATION_SOLUTION | Freq: Once | RESPIRATORY_TRACT | Status: AC
  Administered 2024-12-22: 2.5 mg via RESPIRATORY_TRACT

## 2024-12-22 NOTE — Patient Instructions (Addendum)
 Follow up as needed or as scheduled RESTART the Prednisone  as directed- 3 pills at the same time x3 days, then 2 pills at the same time x3 days, then 1 pill daily.  Take w/ food  USE the Albuterol  inhaler- 2 puffs as needed for cough or shortness of breath CONTINUE the Mucinex  to thin your congestion/drainage ADD Phenylephrine (available OTC) to help dry up your congestion Call with any questions or concerns Stay Safe!  Stay Healthy!

## 2024-12-22 NOTE — Telephone Encounter (Signed)
 Patient has appt this afternoon to address concerns

## 2024-12-22 NOTE — Progress Notes (Signed)
" ° °  Subjective:    Patient ID: Crystal Sims, female    DOB: [birth year], 44 y.o.   MRN: [id number]  HPI Cough- was seen on 1/12 and started on Prednisone  taper for inflammation.  Was seen on 1/15 for worsening sxs and started on Doxycycline  BID for bacterial sinusitis.  Finished Prednisone  yesterday.  Continues to have PND.  Finds herself having a periodically take a deep breath.  Chest is sore from coughing.     Review of Systems For ROS see HPI     Objective:   Physical Exam Vitals reviewed.  Constitutional:      General: She is not in acute distress.    Appearance: Normal appearance. She is not ill-appearing.  HENT:     Head: Normocephalic and atraumatic.     Nose: Congestion present.     Mouth/Throat:     Pharynx: Posterior oropharyngeal erythema (copious PND) present.  Eyes:     Extraocular Movements: Extraocular movements intact.     Conjunctiva/sclera: Conjunctivae normal.  Cardiovascular:     Rate and Rhythm: Regular rhythm. Tachycardia present.  Pulmonary:     Effort: Pulmonary effort is normal. No respiratory distress.     Comments: Decreased breath sounds w/ dry hacking cough.  Air movement improved s/p neb tx and cough quieted.  Lungs CTAB Musculoskeletal:     Cervical back: Neck supple.  Lymphadenopathy:     Cervical: No cervical adenopathy.  Skin:    General: Skin is warm and dry.  Neurological:     General: No focal deficit present.     Mental Status: She is alert and oriented to person, place, and time.  Psychiatric:        Mood and Affect: Mood normal.        Behavior: Behavior normal.        Thought Content: Thought content normal.           Assessment & Plan:  Shortness of breath- new.  Suspect reactive airway inflammation.  Sxs improved s/p neb tx in office. Start albuterol .  Repeat prednisone  taper.  Reviewed supportive care and red flags that should prompt return.  Pt expressed understanding and is in agreement w/ plan.   "

## 2024-12-22 NOTE — Telephone Encounter (Signed)
 FYI Only or Action Required?: FYI only for provider: appointment scheduled on this afternoon.  Patient was last seen in primary care on 12/15/2024 by Mahlon Comer BRAVO, MD.  Called Nurse Triage reporting Sinusitis. - Pt is also reporting needing to take a deep breath now and again. Post nasal drip  Symptoms began several months ago.  Interventions attempted: Other: Several medications, office visits.  Symptoms are: gradually worsening.  Triage Disposition: See PCP When Office is Open (Within 3 Days)  Patient/caregiver understands and will follow disposition?: Yes                          Reason for Triage: Pt stated that she is experiencing chronic post nasal drip which has seem to gotten worse. Pt stated that she also been taking deep breaths and thinks it may be because of the medication prescribed, pt also feels like there is a lump in her throat.    Reason for Disposition  [1] Nasal discharge AND [2] present > 10 days  Answer Assessment - Initial Assessment Questions 1. LOCATION: Where does it hurt?      Not really pain - post nasal drip, congestion 2. ONSET: When did the sinus pain start?  (e.g., hours, days)      months 3. SEVERITY: How bad is the pain?   (Scale 0-10; or none, mild, moderate or severe)     no 4. RECURRENT SYMPTOM: Have you ever had sinus problems before? If Yes, ask: When was the last time? and What happened that time?      yes 5. NASAL CONGESTION: Is the nose blocked? If Yes, ask: Can you open it or must you breathe through your mouth?     no 6. NASAL DISCHARGE: Do you have discharge from your nose? If so ask, What color?     Did not ask 7. FEVER: Do you have a fever? If Yes, ask: What is it, how was it measured, and when did it start?      no 8. OTHER SYMPTOMS: Do you have any other symptoms? (e.g., sore throat, cough, earache, difficulty breathing)     Cough, sinus congestion, sore chest from cough,  post nasal drip 9. PREGNANCY: Is there any chance you are pregnant? When was your last menstrual period?     no  Protocols used: Sinus Pain or Congestion-A-AH

## 2024-12-30 ENCOUNTER — Ambulatory Visit (INDEPENDENT_AMBULATORY_CARE_PROVIDER_SITE_OTHER): Admitting: Otolaryngology

## 2024-12-30 ENCOUNTER — Encounter (INDEPENDENT_AMBULATORY_CARE_PROVIDER_SITE_OTHER): Payer: Self-pay | Admitting: Otolaryngology

## 2024-12-30 VITALS — BP 113/75 | HR 100 | Ht 65.0 in | Wt 198.0 lb

## 2024-12-30 DIAGNOSIS — R053 Chronic cough: Secondary | ICD-10-CM | POA: Diagnosis not present

## 2024-12-30 DIAGNOSIS — R0982 Postnasal drip: Secondary | ICD-10-CM

## 2024-12-30 DIAGNOSIS — J310 Chronic rhinitis: Secondary | ICD-10-CM | POA: Diagnosis not present

## 2024-12-30 DIAGNOSIS — J209 Acute bronchitis, unspecified: Secondary | ICD-10-CM

## 2024-12-30 MED ORDER — AZITHROMYCIN 250 MG PO TABS: ORAL_TABLET | ORAL | 0 refills | Status: AC

## 2025-01-01 DIAGNOSIS — R053 Chronic cough: Secondary | ICD-10-CM | POA: Insufficient documentation

## 2025-01-01 NOTE — Progress Notes (Signed)
 Patient ID: Crystal Sims, female   DOB: 01-08-1981, 44 y.o.   MRN: [id number]  Follow up: Chronic nasal congestion, nasal drainage   History of Present Illness Crystal Sims is a 44 year old female with asthma and chronic rhinitis who presents with persistent cough and postnasal drainage. The patient was previously seen for chronic nasal congestion. She was noted to have nasal mucosal congestion and bilateral inferior turbinate hypertrophy. She underwent bilateral turbinate reduction surgery in June 2024.   In January 2026, she developed severe postnasal drainage with sensation of mucus in the posterior oropharynx, which progressed to significant chest congestion. She was unable to expectorate mucus but experienced persistent drainage and congestion, occasionally requiring deep breaths for relief. The drainage has been ongoing, with symptoms worsening when medications are discontinued. She continues to have a persistent cough and reports sensation of drainage in her chest. She denies current thick or yellow mucus.  She notes intermittent mild facial pressure, particularly in the sinus regions, but denies current facial pain. She denies shortness of breath at rest, hemoptysis, or wheezing.  She completed a course of doxycycline , two courses of prednisone , a cough suppressant, and used an inhaler. Symptoms improved only while on medication and recurred upon cessation, particularly the cough and postnasal drainage.  She continues to use ipratropium bromide  nasal spray and performs intermittent saline irrigation. She uses Mucinex  DM as needed for cough or thick mucus, though she currently does not have thick or yellow mucus. She denies known drug allergies and has never taken azithromycin .   Exam: General: Communicates without difficulty, well nourished, no acute distress. Head: Normocephalic, no evidence injury, no tenderness, facial buttresses intact without stepoff. Face/sinus: No tenderness to  palpation and percussion. Facial movement is normal and symmetric. Eyes: PERRL, EOMI. No scleral icterus, conjunctivae clear. Neuro: CN II exam reveals vision grossly intact.  No nystagmus at any point of gaze. Ears: Auricles well formed without lesions.  Ear canals are intact without mass or lesion.  No erythema or edema is appreciated.  The TMs are intact without fluid. Nose: External evaluation reveals normal support and skin without lesions.  Dorsum is intact.  Anterior rhinoscopy reveals congested mucosa over anterior aspect of inferior turbinates and intact septum.  No purulence noted. Oral:  Oral cavity and oropharynx are intact, symmetric, without erythema or edema.  Mucosa is moist without lesions. Neck: Full range of motion without pain.  There is no significant lymphadenopathy.  No masses palpable.  Thyroid bed within normal limits to palpation.  Parotid glands and submandibular glands equal bilaterally without mass.  Trachea is midline. Neuro:  CN 2-12 grossly intact.   Assessment & Plan Acute bronchitis and chronic cough She has persistent cough and postnasal drainage following a recent viral upper respiratory infection that progressed to a bacterial infection. Symptoms recurred after completion of doxycycline , prednisone , cough suppressant, and inhaler.  - Prescribed azithromycin  (Z-Pak) with dosing instructions. - Advised to return if symptoms persist or worsen. - Planned follow-up in six months unless symptoms recur sooner.  Chronic rhinitis with postnasal drainage Chronic rhinitis with postnasal drainage, previously managed with ipratropium bromide  nasal spray and intermittent saline irrigation. Current symptoms likely reflect underlying rhinitis, with no acute sinus pathology on examination. - Advised continued use of ipratropium bromide  nasal spray for postnasal drainage. - Recommended regular saline nasal irrigation.

## 2025-06-30 ENCOUNTER — Ambulatory Visit (INDEPENDENT_AMBULATORY_CARE_PROVIDER_SITE_OTHER): Admitting: Otolaryngology

## 2025-10-11 ENCOUNTER — Ambulatory Visit: Admitting: Dermatology
